# Patient Record
Sex: Female | Born: 1935 | Race: Black or African American | Hispanic: No | Smoking: Never smoker
Health system: Southern US, Community
[De-identification: ages and names within clinical notes are randomized; demographics above are authoritative.]

## PROBLEM LIST (undated history)

## (undated) DIAGNOSIS — I10 Essential (primary) hypertension: Secondary | ICD-10-CM

## (undated) DIAGNOSIS — E669 Obesity, unspecified: Secondary | ICD-10-CM

## (undated) DIAGNOSIS — C50919 Malignant neoplasm of unspecified site of unspecified female breast: Secondary | ICD-10-CM

## (undated) DIAGNOSIS — N19 Unspecified kidney failure: Secondary | ICD-10-CM

## (undated) DIAGNOSIS — I4892 Unspecified atrial flutter: Secondary | ICD-10-CM

## (undated) DIAGNOSIS — R413 Other amnesia: Secondary | ICD-10-CM

## (undated) DIAGNOSIS — F068 Other specified mental disorders due to known physiological condition: Secondary | ICD-10-CM

## (undated) DIAGNOSIS — E119 Type 2 diabetes mellitus without complications: Secondary | ICD-10-CM

## (undated) DIAGNOSIS — N950 Postmenopausal bleeding: Secondary | ICD-10-CM

## (undated) DIAGNOSIS — I5022 Chronic systolic (congestive) heart failure: Secondary | ICD-10-CM

## (undated) DIAGNOSIS — J449 Chronic obstructive pulmonary disease, unspecified: Secondary | ICD-10-CM

## (undated) DIAGNOSIS — D259 Leiomyoma of uterus, unspecified: Secondary | ICD-10-CM

## (undated) DIAGNOSIS — Z9049 Acquired absence of other specified parts of digestive tract: Secondary | ICD-10-CM

## (undated) HISTORY — DX: Unspecified kidney failure: N19

## (undated) HISTORY — DX: Malignant neoplasm of unspecified site of unspecified female breast: C50.919

## (undated) HISTORY — DX: Acquired absence of other specified parts of digestive tract: Z90.49

## (undated) HISTORY — DX: Chronic obstructive pulmonary disease, unspecified: J44.9

## (undated) HISTORY — DX: Essential (primary) hypertension: I10

## (undated) HISTORY — DX: Chronic systolic (congestive) heart failure: I50.22

## (undated) HISTORY — DX: Leiomyoma of uterus, unspecified: D25.9

## (undated) HISTORY — DX: Other amnesia: R41.3

## (undated) HISTORY — DX: Type 2 diabetes mellitus without complications: E11.9

## (undated) HISTORY — DX: Unspecified atrial flutter: I48.92

## (undated) HISTORY — DX: Obesity, unspecified: E66.9

## (undated) HISTORY — DX: Postmenopausal bleeding: N95.0

## (undated) HISTORY — DX: Other specified mental disorders due to known physiological condition: F06.8

---

## 2004-12-15 ENCOUNTER — Emergency Department: Payer: Self-pay | Admitting: Emergency Medicine

## 2005-02-16 ENCOUNTER — Emergency Department: Payer: Self-pay | Admitting: Emergency Medicine

## 2005-08-18 ENCOUNTER — Emergency Department: Payer: Self-pay | Admitting: Emergency Medicine

## 2005-09-06 ENCOUNTER — Encounter: Admission: RE | Admit: 2005-09-06 | Discharge: 2005-10-25 | Payer: Self-pay | Admitting: Family Medicine

## 2005-09-07 ENCOUNTER — Emergency Department: Payer: Self-pay | Admitting: Emergency Medicine
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
## 2006-01-21 ENCOUNTER — Emergency Department (HOSPITAL_COMMUNITY): Admission: EM | Admit: 2006-01-21 | Discharge: 2006-01-21 | Payer: Self-pay | Admitting: Emergency Medicine

## 2006-01-22 ENCOUNTER — Emergency Department (HOSPITAL_COMMUNITY): Admission: EM | Admit: 2006-01-22 | Discharge: 2006-01-22 | Payer: Self-pay | Admitting: Emergency Medicine

## 2006-04-14 ENCOUNTER — Emergency Department: Payer: Self-pay | Admitting: Emergency Medicine

## 2006-06-14 ENCOUNTER — Encounter: Admission: RE | Admit: 2006-06-14 | Discharge: 2006-08-15 | Payer: Self-pay | Admitting: Family Medicine

## 2006-07-21 ENCOUNTER — Ambulatory Visit: Payer: Self-pay | Admitting: Family Medicine

## 2006-08-03 ENCOUNTER — Ambulatory Visit: Payer: Self-pay | Admitting: Family Medicine

## 2006-08-09 ENCOUNTER — Ambulatory Visit: Payer: Self-pay

## 2006-08-22 ENCOUNTER — Ambulatory Visit: Payer: Self-pay | Admitting: Family Medicine

## 2006-09-07 ENCOUNTER — Ambulatory Visit: Payer: Self-pay | Admitting: Family Medicine

## 2006-10-25 ENCOUNTER — Ambulatory Visit: Payer: Self-pay | Admitting: Family Medicine

## 2006-11-02 ENCOUNTER — Ambulatory Visit: Payer: Self-pay | Admitting: Family Medicine

## 2006-11-02 LAB — CONVERTED CEMR LAB: Hgb A1c MFr Bld: 6.8 %

## 2007-01-20 ENCOUNTER — Ambulatory Visit: Payer: Self-pay | Admitting: Family Medicine

## 2007-01-25 ENCOUNTER — Encounter: Payer: Self-pay | Admitting: Family Medicine

## 2007-01-25 DIAGNOSIS — K5900 Constipation, unspecified: Secondary | ICD-10-CM | POA: Insufficient documentation

## 2007-01-25 DIAGNOSIS — J309 Allergic rhinitis, unspecified: Secondary | ICD-10-CM | POA: Insufficient documentation

## 2007-01-25 DIAGNOSIS — I10 Essential (primary) hypertension: Secondary | ICD-10-CM | POA: Insufficient documentation

## 2007-04-26 ENCOUNTER — Telehealth: Payer: Self-pay | Admitting: Family Medicine

## 2007-05-31 ENCOUNTER — Ambulatory Visit: Payer: Self-pay | Admitting: Internal Medicine

## 2007-05-31 DIAGNOSIS — M25559 Pain in unspecified hip: Secondary | ICD-10-CM

## 2007-06-02 ENCOUNTER — Encounter: Admission: RE | Admit: 2007-06-02 | Discharge: 2007-06-02 | Payer: Self-pay | Admitting: Family Medicine

## 2007-06-08 ENCOUNTER — Encounter (INDEPENDENT_AMBULATORY_CARE_PROVIDER_SITE_OTHER): Payer: Self-pay | Admitting: *Deleted

## 2007-08-14 ENCOUNTER — Ambulatory Visit: Payer: Self-pay | Admitting: Family Medicine

## 2007-08-16 ENCOUNTER — Telehealth: Payer: Self-pay | Admitting: Family Medicine

## 2007-08-24 ENCOUNTER — Encounter: Payer: Self-pay | Admitting: Family Medicine

## 2007-08-30 ENCOUNTER — Telehealth: Payer: Self-pay | Admitting: Family Medicine

## 2007-09-15 ENCOUNTER — Ambulatory Visit: Payer: Self-pay | Admitting: Family Medicine

## 2007-11-07 ENCOUNTER — Encounter (INDEPENDENT_AMBULATORY_CARE_PROVIDER_SITE_OTHER): Payer: Self-pay | Admitting: *Deleted

## 2007-11-14 ENCOUNTER — Encounter (INDEPENDENT_AMBULATORY_CARE_PROVIDER_SITE_OTHER): Payer: Self-pay | Admitting: *Deleted

## 2008-01-02 ENCOUNTER — Ambulatory Visit: Payer: Self-pay | Admitting: Family Medicine

## 2008-01-02 LAB — CONVERTED CEMR LAB
ALT: 19 units/L (ref 0–35)
AST: 19 units/L (ref 0–37)
Albumin: 3.3 g/dL — ABNORMAL LOW (ref 3.5–5.2)
Alkaline Phosphatase: 48 units/L (ref 39–117)
BUN: 15 mg/dL (ref 6–23)
CO2: 35 meq/L — ABNORMAL HIGH (ref 19–32)
Calcium: 9.2 mg/dL (ref 8.4–10.5)
Chloride: 103 meq/L (ref 96–112)
Creatinine, Ser: 1.1 mg/dL (ref 0.4–1.2)
GFR calc Af Amer: 61 mL/min
Glucose, Bld: 124 mg/dL — ABNORMAL HIGH (ref 70–99)
HDL: 44.4 mg/dL (ref >39.0)
VLDL: 21 mg/dL (ref 0–40)

## 2008-01-03 ENCOUNTER — Ambulatory Visit: Payer: Self-pay | Admitting: Family Medicine

## 2008-01-03 DIAGNOSIS — E876 Hypokalemia: Secondary | ICD-10-CM

## 2008-01-10 ENCOUNTER — Telehealth (INDEPENDENT_AMBULATORY_CARE_PROVIDER_SITE_OTHER): Payer: Self-pay | Admitting: *Deleted

## 2008-01-17 ENCOUNTER — Ambulatory Visit: Payer: Self-pay | Admitting: Family Medicine

## 2008-01-22 LAB — CONVERTED CEMR LAB
Calcium: 8.8 mg/dL (ref 8.4–10.5)
Creatinine, Ser: 1.2 mg/dL (ref 0.4–1.2)
Creatinine,U: 195.4 mg/dL
GFR calc Af Amer: 55 mL/min
GFR calc non Af Amer: 46 mL/min
Glucose, Bld: 119 mg/dL — ABNORMAL HIGH (ref 70–99)
Hgb A1c MFr Bld: 6.8 % — ABNORMAL HIGH (ref 4.6–6.0)
Potassium: 4 meq/L (ref 3.5–5.1)
Sodium: 144 meq/L (ref 135–145)

## 2008-03-05 ENCOUNTER — Emergency Department (HOSPITAL_COMMUNITY): Admission: EM | Admit: 2008-03-05 | Discharge: 2008-03-05 | Payer: Self-pay | Admitting: Emergency Medicine

## 2008-03-12 ENCOUNTER — Emergency Department (HOSPITAL_COMMUNITY): Admission: EM | Admit: 2008-03-12 | Discharge: 2008-03-13 | Payer: Self-pay | Admitting: Emergency Medicine

## 2008-03-18 ENCOUNTER — Encounter (INDEPENDENT_AMBULATORY_CARE_PROVIDER_SITE_OTHER): Payer: Self-pay | Admitting: *Deleted

## 2008-03-22 ENCOUNTER — Ambulatory Visit: Payer: Self-pay | Admitting: Family Medicine

## 2008-03-22 DIAGNOSIS — S22009A Unspecified fracture of unspecified thoracic vertebra, initial encounter for closed fracture: Secondary | ICD-10-CM | POA: Insufficient documentation

## 2008-03-29 ENCOUNTER — Ambulatory Visit: Payer: Self-pay | Admitting: Family Medicine

## 2008-04-02 ENCOUNTER — Ambulatory Visit: Payer: Self-pay | Admitting: Family Medicine

## 2008-04-16 ENCOUNTER — Encounter: Payer: Self-pay | Admitting: Family Medicine

## 2008-04-19 ENCOUNTER — Encounter: Payer: Self-pay | Admitting: Family Medicine

## 2008-05-02 ENCOUNTER — Encounter: Payer: Self-pay | Admitting: Family Medicine

## 2008-05-03 DIAGNOSIS — C50919 Malignant neoplasm of unspecified site of unspecified female breast: Secondary | ICD-10-CM | POA: Insufficient documentation

## 2008-05-21 ENCOUNTER — Encounter: Admission: RE | Admit: 2008-05-21 | Discharge: 2008-05-21 | Payer: Self-pay | Admitting: Family Medicine

## 2008-06-05 ENCOUNTER — Encounter: Admission: RE | Admit: 2008-06-05 | Discharge: 2008-06-05 | Payer: Self-pay | Admitting: Family Medicine

## 2008-06-05 ENCOUNTER — Encounter (INDEPENDENT_AMBULATORY_CARE_PROVIDER_SITE_OTHER): Payer: Self-pay | Admitting: Diagnostic Radiology

## 2008-06-05 ENCOUNTER — Encounter: Payer: Self-pay | Admitting: Family Medicine

## 2008-06-05 HISTORY — PX: BREAST LUMPECTOMY: SHX2

## 2008-06-06 ENCOUNTER — Telehealth: Payer: Self-pay | Admitting: Family Medicine

## 2008-06-10 ENCOUNTER — Telehealth: Payer: Self-pay | Admitting: Family Medicine

## 2008-06-10 ENCOUNTER — Ambulatory Visit: Payer: Self-pay | Admitting: Family Medicine

## 2008-06-11 LAB — CONVERTED CEMR LAB: Creatinine, Ser: 1.5 mg/dL — ABNORMAL HIGH (ref 0.4–1.2)

## 2008-06-17 ENCOUNTER — Encounter: Payer: Self-pay | Admitting: Family Medicine

## 2008-06-18 ENCOUNTER — Ambulatory Visit: Payer: Self-pay | Admitting: Oncology

## 2008-06-21 ENCOUNTER — Ambulatory Visit: Admission: RE | Admit: 2008-06-21 | Discharge: 2008-08-11 | Payer: Self-pay | Admitting: Radiation Oncology

## 2008-06-24 ENCOUNTER — Encounter: Payer: Self-pay | Admitting: Family Medicine

## 2008-06-25 ENCOUNTER — Encounter: Admission: RE | Admit: 2008-06-25 | Discharge: 2008-06-25 | Payer: Self-pay | Admitting: Family Medicine

## 2008-07-01 ENCOUNTER — Encounter (INDEPENDENT_AMBULATORY_CARE_PROVIDER_SITE_OTHER): Payer: Self-pay | Admitting: *Deleted

## 2008-07-02 ENCOUNTER — Encounter: Admission: RE | Admit: 2008-07-02 | Discharge: 2008-07-02 | Payer: Self-pay | Admitting: Surgery

## 2008-07-04 ENCOUNTER — Ambulatory Visit (HOSPITAL_BASED_OUTPATIENT_CLINIC_OR_DEPARTMENT_OTHER): Admission: RE | Admit: 2008-07-04 | Discharge: 2008-07-04 | Payer: Self-pay | Admitting: Surgery

## 2008-07-04 ENCOUNTER — Encounter: Admission: RE | Admit: 2008-07-04 | Discharge: 2008-07-04 | Payer: Self-pay | Admitting: Surgery

## 2008-07-04 ENCOUNTER — Encounter (INDEPENDENT_AMBULATORY_CARE_PROVIDER_SITE_OTHER): Payer: Self-pay | Admitting: Surgery

## 2008-07-05 ENCOUNTER — Encounter: Payer: Self-pay | Admitting: Family Medicine

## 2008-07-19 ENCOUNTER — Telehealth: Payer: Self-pay | Admitting: Family Medicine

## 2008-07-31 ENCOUNTER — Encounter: Payer: Self-pay | Admitting: Family Medicine

## 2008-10-23 ENCOUNTER — Ambulatory Visit: Payer: Self-pay | Admitting: Oncology

## 2008-10-25 ENCOUNTER — Encounter: Payer: Self-pay | Admitting: Family Medicine

## 2008-11-05 HISTORY — PX: OTHER SURGICAL HISTORY: SHX169

## 2008-11-12 ENCOUNTER — Ambulatory Visit: Payer: Self-pay | Admitting: Family Medicine

## 2008-11-13 ENCOUNTER — Ambulatory Visit: Payer: Self-pay | Admitting: Cardiology

## 2008-11-13 LAB — CONVERTED CEMR LAB
Albumin: 3.3 g/dL — ABNORMAL LOW (ref 3.5–5.2)
Basophils Relative: 0.1 % (ref 0.0–3.0)
Eosinophils Relative: 3.3 % (ref 0.0–5.0)
HCT: 36.3 % (ref 36.0–46.0)
Hgb A1c MFr Bld: 6.5 % — ABNORMAL HIGH (ref 4.6–6.0)
Lymphocytes Relative: 30.1 % (ref 12.0–46.0)
MCHC: 33 g/dL (ref 30.0–36.0)
MCV: 89.6 fL (ref 78.0–100.0)
Platelets: 100 10*3/uL — ABNORMAL LOW (ref 150–400)
Potassium: 2.9 meq/L — ABNORMAL LOW (ref 3.5–5.1)
RBC: 4.05 M/uL (ref 3.87–5.11)
Total Bilirubin: 0.9 mg/dL (ref 0.3–1.2)
Total CHOL/HDL Ratio: 4.5
Triglycerides: 108 mg/dL (ref 0–149)
VLDL: 22 mg/dL (ref 0–40)

## 2008-11-26 ENCOUNTER — Ambulatory Visit: Payer: Self-pay | Admitting: Cardiology

## 2008-11-26 ENCOUNTER — Ambulatory Visit: Payer: Self-pay | Admitting: Internal Medicine

## 2008-11-26 ENCOUNTER — Encounter: Payer: Self-pay | Admitting: Family Medicine

## 2008-11-28 IMAGING — CR DG CHEST 2V
2 series · 2 of 2 positions shown · non-contrast
Comparison: Thoracic spine study from 03/05/2008

CLINICAL DATA: Preop evaluation for breast cancer surgery.

CHEST - 2 VIEW

[w chest pa]
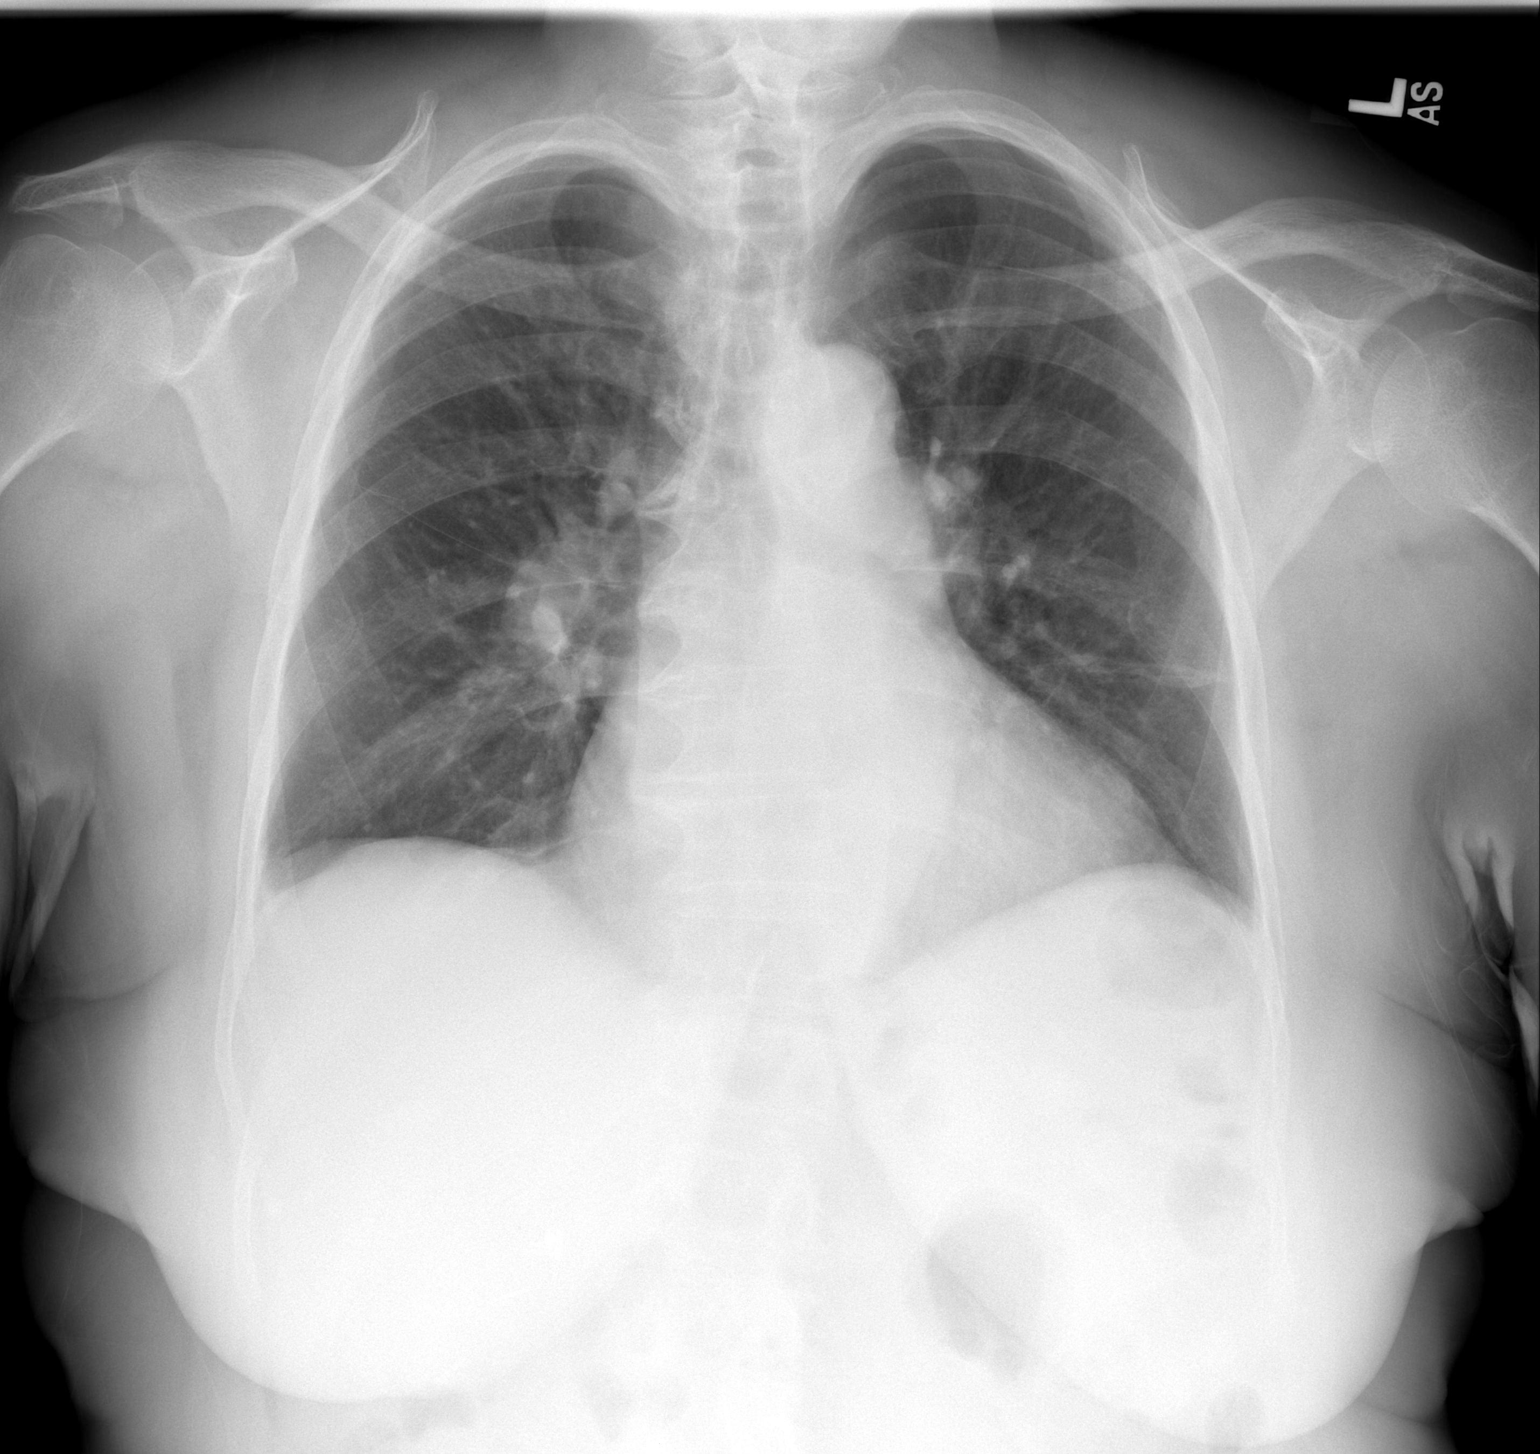

[w chest lat]
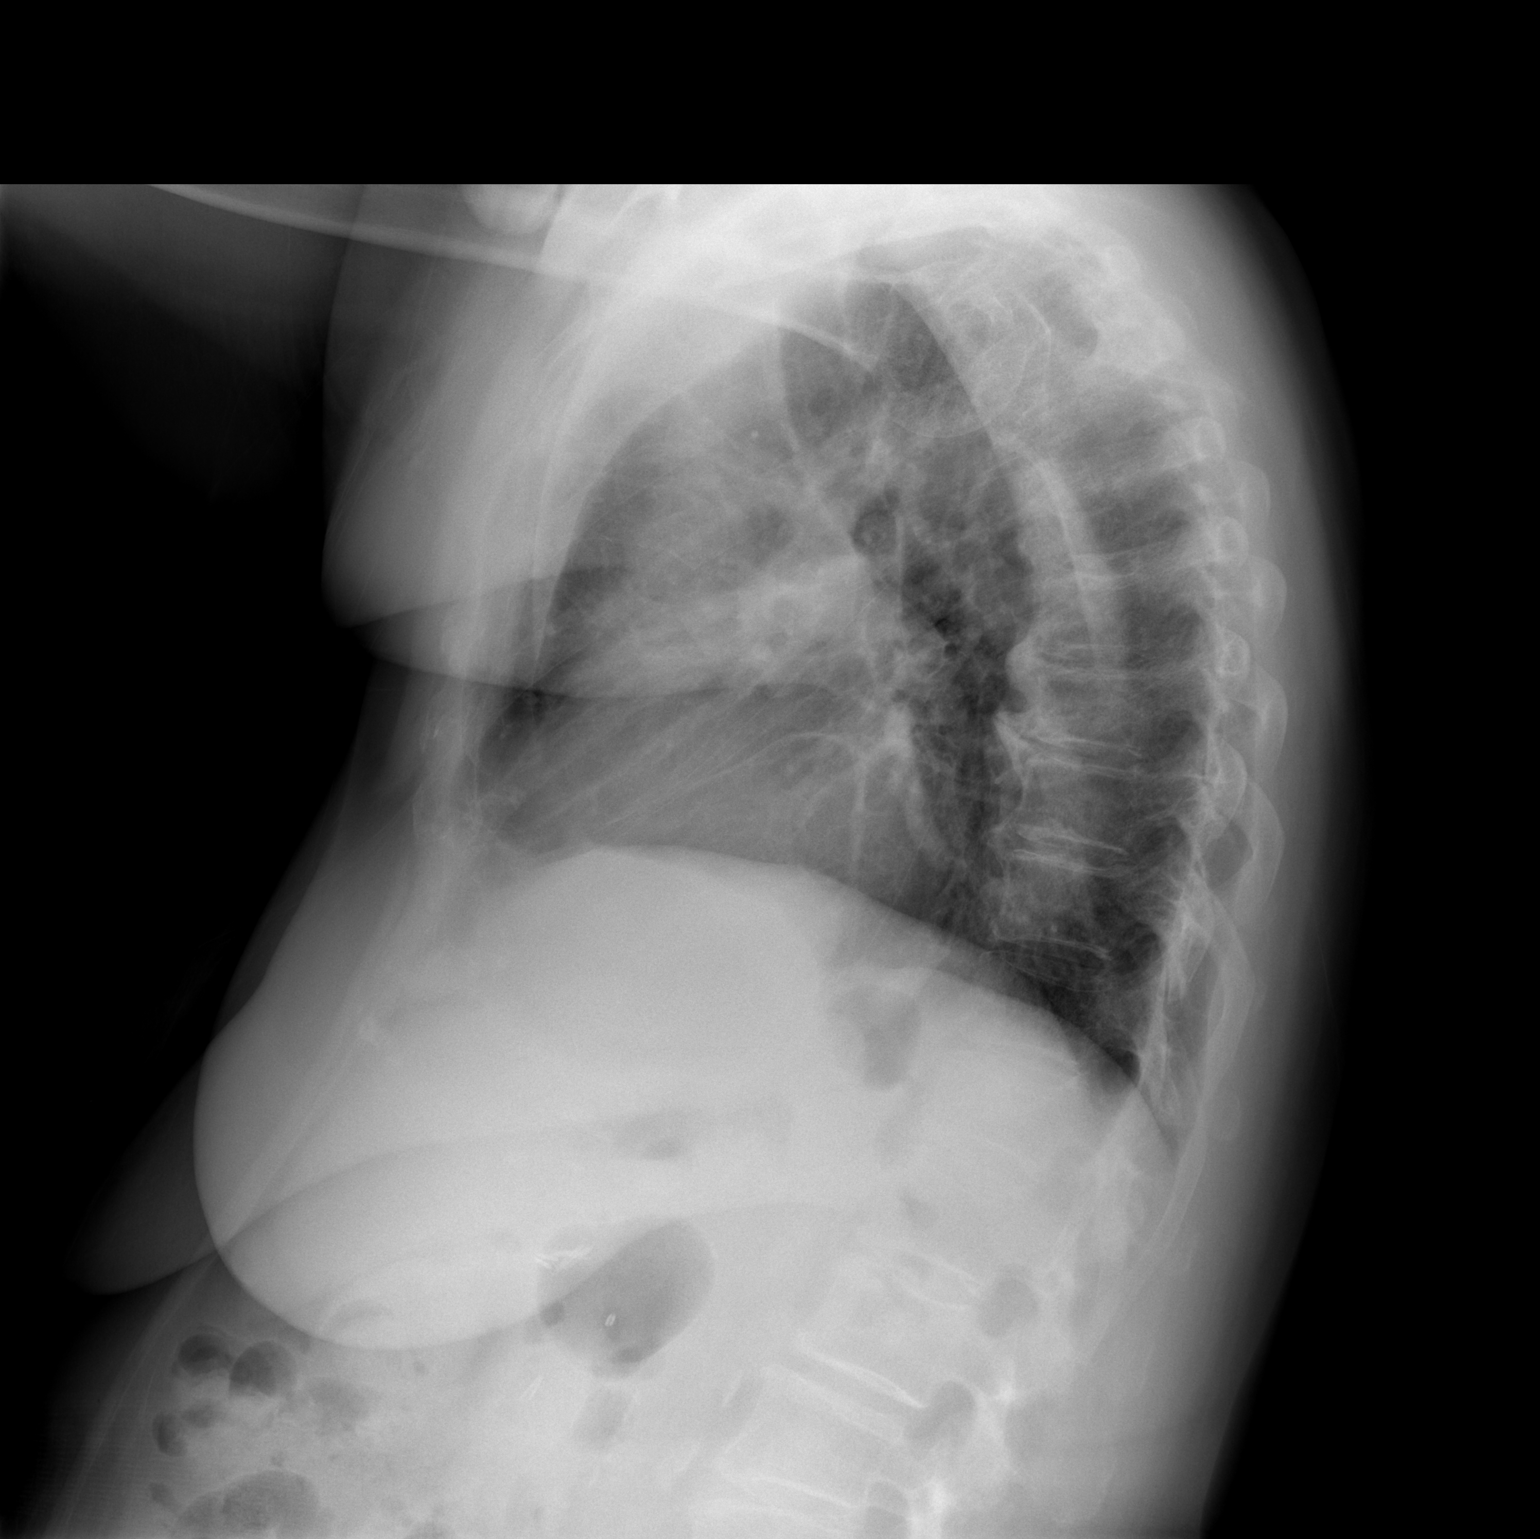

[2 of 2 positions shown; findings below may reference images not displayed]

FINDINGS: Two-view chest shows low lung volumes.  Cardiopericardial
silhouette is at upper limits of normal for size.  Right hilum is
slightly prominent, but this is unchanged since the prior thoracic
spine x-ray and is probably related to vascularity.  No focal
consolidation, edema, or pleural effusion.  Bony structures of the
visualized thorax are intact.  There is some linear scarring in the
left midlung.
IMPRESSION: Low volume film with borderline cardiac enlargement.  No acute
cardiopulmonary findings.

## 2008-12-06 HISTORY — PX: VAGINAL HYSTERECTOMY: SHX2639

## 2008-12-10 ENCOUNTER — Ambulatory Visit: Payer: Self-pay

## 2008-12-13 ENCOUNTER — Encounter: Payer: Self-pay | Admitting: Family Medicine

## 2008-12-13 ENCOUNTER — Ambulatory Visit: Payer: Self-pay | Admitting: Cardiology

## 2008-12-16 LAB — CONVERTED CEMR LAB
BUN: 25 mg/dL — ABNORMAL HIGH (ref 6–23)
CO2: 28 meq/L (ref 19–32)
Calcium: 8.9 mg/dL (ref 8.4–10.5)
Potassium: 3.5 meq/L (ref 3.5–5.3)

## 2008-12-26 ENCOUNTER — Ambulatory Visit: Payer: Self-pay | Admitting: Oncology

## 2009-01-06 DIAGNOSIS — I5022 Chronic systolic (congestive) heart failure: Secondary | ICD-10-CM

## 2009-01-06 HISTORY — DX: Chronic systolic (congestive) heart failure: I50.22

## 2009-01-27 ENCOUNTER — Telehealth: Payer: Self-pay | Admitting: Family Medicine

## 2009-01-27 DIAGNOSIS — F0390 Unspecified dementia without behavioral disturbance: Secondary | ICD-10-CM

## 2009-01-30 ENCOUNTER — Ambulatory Visit: Payer: Self-pay

## 2009-01-30 ENCOUNTER — Encounter: Payer: Self-pay | Admitting: Cardiology

## 2009-02-06 ENCOUNTER — Telehealth: Payer: Self-pay | Admitting: Family Medicine

## 2009-02-07 ENCOUNTER — Telehealth: Payer: Self-pay | Admitting: Family Medicine

## 2009-02-11 ENCOUNTER — Ambulatory Visit: Payer: Self-pay | Admitting: Oncology

## 2009-02-13 ENCOUNTER — Telehealth: Payer: Self-pay | Admitting: Family Medicine

## 2009-02-14 ENCOUNTER — Telehealth: Payer: Self-pay | Admitting: Family Medicine

## 2009-02-14 ENCOUNTER — Encounter: Payer: Self-pay | Admitting: Cardiology

## 2009-02-14 ENCOUNTER — Ambulatory Visit: Payer: Self-pay | Admitting: Cardiology

## 2009-02-14 DIAGNOSIS — E785 Hyperlipidemia, unspecified: Secondary | ICD-10-CM

## 2009-02-14 DIAGNOSIS — R9389 Abnormal findings on diagnostic imaging of other specified body structures: Secondary | ICD-10-CM

## 2009-02-19 ENCOUNTER — Telehealth: Payer: Self-pay | Admitting: Family Medicine

## 2009-02-26 ENCOUNTER — Ambulatory Visit: Payer: Self-pay | Admitting: Cardiology

## 2009-02-26 LAB — CONVERTED CEMR LAB
ALT: 18 units/L (ref 0–35)
Albumin: 3.7 g/dL (ref 3.5–5.2)
Alkaline Phosphatase: 46 units/L (ref 39–117)
Bilirubin, Direct: 0.1 mg/dL (ref 0.0–0.3)
Cholesterol: 187 mg/dL (ref 0–200)
HDL: 47 mg/dL (ref >39)
Indirect Bilirubin: 0.5 mg/dL (ref 0.0–0.9)
LDL Cholesterol: 115 mg/dL — ABNORMAL HIGH (ref 0–99)
Total CHOL/HDL Ratio: 4

## 2009-03-12 ENCOUNTER — Ambulatory Visit: Payer: Self-pay | Admitting: Cardiology

## 2009-03-31 ENCOUNTER — Encounter: Payer: Self-pay | Admitting: Family Medicine

## 2009-04-29 ENCOUNTER — Emergency Department: Payer: Self-pay | Admitting: Emergency Medicine

## 2009-05-01 ENCOUNTER — Encounter: Payer: Self-pay | Admitting: Family Medicine

## 2009-06-17 ENCOUNTER — Encounter: Payer: Self-pay | Admitting: Family Medicine

## 2009-07-29 ENCOUNTER — Emergency Department (HOSPITAL_COMMUNITY): Admission: EM | Admit: 2009-07-29 | Discharge: 2009-07-29 | Payer: Self-pay | Admitting: Emergency Medicine

## 2009-08-18 ENCOUNTER — Ambulatory Visit: Payer: Self-pay | Admitting: Cardiology

## 2009-09-30 ENCOUNTER — Encounter: Payer: Self-pay | Admitting: Cardiology

## 2010-01-06 ENCOUNTER — Telehealth: Payer: Self-pay | Admitting: Family Medicine

## 2010-01-12 ENCOUNTER — Encounter: Payer: Self-pay | Admitting: Family Medicine

## 2010-01-13 ENCOUNTER — Telehealth (INDEPENDENT_AMBULATORY_CARE_PROVIDER_SITE_OTHER): Payer: Self-pay | Admitting: *Deleted

## 2010-01-15 ENCOUNTER — Encounter: Payer: Self-pay | Admitting: Family Medicine

## 2010-02-02 ENCOUNTER — Telehealth: Payer: Self-pay | Admitting: Family Medicine

## 2010-02-06 ENCOUNTER — Ambulatory Visit: Payer: Self-pay | Admitting: Family Medicine

## 2010-02-06 DIAGNOSIS — N950 Postmenopausal bleeding: Secondary | ICD-10-CM

## 2010-02-06 HISTORY — DX: Postmenopausal bleeding: N95.0

## 2010-02-06 LAB — CONVERTED CEMR LAB
Glucose, Urine, Semiquant: NEGATIVE
Ketones, urine, test strip: NEGATIVE
Nitrite: NEGATIVE
pH: 6

## 2010-02-10 ENCOUNTER — Encounter: Payer: Self-pay | Admitting: Family Medicine

## 2010-02-11 ENCOUNTER — Ambulatory Visit: Payer: Self-pay | Admitting: Family Medicine

## 2010-02-11 ENCOUNTER — Telehealth: Payer: Self-pay | Admitting: Family Medicine

## 2010-02-11 DIAGNOSIS — E538 Deficiency of other specified B group vitamins: Secondary | ICD-10-CM | POA: Insufficient documentation

## 2010-02-11 LAB — CONVERTED CEMR LAB
ALT: 12 units/L (ref 0–35)
AST: 15 units/L (ref 0–37)
BUN: 18 mg/dL (ref 6–23)
Basophils Relative: 0.6 % (ref 0.0–3.0)
Cholesterol: 198 mg/dL (ref 0–200)
Eosinophils Relative: 3.6 % (ref 0.0–5.0)
HCT: 35.9 % — ABNORMAL LOW (ref 36.0–46.0)
HDL: 64.4 mg/dL (ref >39.00)
Hemoglobin: 11.3 g/dL — ABNORMAL LOW (ref 12.0–15.0)
LDL Cholesterol: 111 mg/dL — ABNORMAL HIGH (ref 0–99)
Lymphocytes Relative: 22.4 % (ref 12.0–46.0)
Microalb, Ur: 0.7 mg/dL (ref 0.0–1.9)
Platelets: 84 10*3/uL — ABNORMAL LOW (ref 150.0–400.0)
Potassium: 3.6 meq/L (ref 3.5–5.1)
TSH: 2.19 microintl units/mL (ref 0.35–5.50)
Total CHOL/HDL Ratio: 3

## 2010-02-17 ENCOUNTER — Telehealth: Payer: Self-pay | Admitting: Family Medicine

## 2010-02-17 ENCOUNTER — Encounter (INDEPENDENT_AMBULATORY_CARE_PROVIDER_SITE_OTHER): Payer: Self-pay | Admitting: *Deleted

## 2010-04-07 ENCOUNTER — Telehealth (INDEPENDENT_AMBULATORY_CARE_PROVIDER_SITE_OTHER): Payer: Self-pay | Admitting: *Deleted

## 2010-04-16 ENCOUNTER — Telehealth: Payer: Self-pay | Admitting: Family Medicine

## 2010-07-08 ENCOUNTER — Emergency Department (HOSPITAL_COMMUNITY): Admission: EM | Admit: 2010-07-08 | Discharge: 2010-07-08 | Payer: Self-pay | Admitting: Emergency Medicine

## 2010-07-08 ENCOUNTER — Telehealth: Payer: Self-pay | Admitting: Family Medicine

## 2010-09-02 ENCOUNTER — Emergency Department: Payer: Self-pay | Admitting: Emergency Medicine

## 2010-09-08 ENCOUNTER — Encounter: Payer: Self-pay | Admitting: Family Medicine

## 2010-12-28 ENCOUNTER — Encounter: Payer: Self-pay | Admitting: Family Medicine

## 2011-01-05 NOTE — Progress Notes (Signed)
Summary: pt going to ER  Phone Note Call from Patient   Caller: Patient Call For: Kerby Nora MD Summary of Call: Pt had appt to see you today for follow up but she says she fell a couple of days ago and her legs and arms are hurting.  She thinks she needs to go to the ER. I told her I would cancel her appt but that she will need to call back to schedule follow up visit. Initial call taken by: Lowella Petties CMA,  July 08, 2010 1:08 PM

## 2011-01-05 NOTE — Letter (Signed)
Summary: Unable To Reach-Consult Scheduled  [place] [facility]  [address] [place], [place] [address]   Phone: [phone]  Fax: [phone]    01/15/2010 MRN: [id number]    Dear Ms. Stepka,   We have been unable to reach you by phone.  Please contact our office with an updated phone number as soon as possible.  Please ask to speak to Dr. Daphine Deutscher nurse when calling.       Thank you,    Linde Gillis, CMA (AAMA) [place] [facility]

## 2011-01-05 NOTE — Progress Notes (Signed)
Summary: Pt cant afford benicar  Phone Note From Pharmacy   [facility]  [address] [phone]* Summary of Call: Note from pharmacy states that pt cannot afford the benicar and is requesting something else.  Please advise. Initial call taken by: Lowella Petties CMA,  January 13, 2010 3:49 PM  Follow-up for Phone Call        Please see last phone note..ask pharm out of other other listed rx's what would be covered for her. If none tell patient we will HAVE to do generic.  Follow-up by: Kerby Nora MD,  January 13, 2010 11:59 PM  Additional Follow-up for Phone Call Additional follow up Details #1::        Spoke with the pharmacist at [facility] [address] and he advised that Micardis, Diovan, and Atacand were all in the same price range as Benicar.  Benicar is about $115 for 30 pills.  Spoke with Dr. Ermalene Searing about above and was advised that unless patient is willing to pay out of pocket then the only other choice would be a generic medication.  Called patient on cell phone and got no answer or machine.  Will call back later.  Linde Gillis CMA Duncan Dull)  January 14, 2010 8:35 AM   Called patient back again, no answer and no machine.  Linde Gillis CMA Duncan Dull)  January 14, 2010 11:34 AM   Called patient got no answer or machine.  Linde Gillis CMA Duncan Dull)  January 14, 2010 12:27 PM   Called patient again, no answer no machine.  Linde Gillis CMA Duncan Dull)  January 14, 2010 3:10 PM   Called patient again, no answer no machine.  Linde Gillis CMA Duncan Dull)  January 14, 2010 4:57 PM   Called patient on cell phone, received message saying that the person you are trying to reach is unavailable at this time.  Will call back later.  Linde Gillis CMA Duncan Dull)  January 15, 2010 8:28 AM   Called patient and got no answer or machine.  Will keep trying to reach her but will mail out letter as well.  Linde Gillis CMA Duncan Dull)  January 15, 2010 11:10 AM     Additional Follow-up for Phone Call Additional follow up Details  #2::    called and patient number has been disconnected now Follow-up by: Benny Lennert CMA (AAMA),  January 16, 2010 9:01 AM  New/Updated Medications: LOSARTAN POTASSIUM-HCTZ 100-12.5 MG TABS (LOSARTAN POTASSIUM-HCTZ) 1 tab by mouth daily Prescriptions: LOSARTAN POTASSIUM-HCTZ 100-12.5 MG TABS (LOSARTAN POTASSIUM-HCTZ) 1 tab by mouth daily  #30 x 11   Entered and Authorized by:   Kerby Nora MD   Signed by:   Kerby Nora MD on 01/14/2010   Method used:   [facility]        [facility] [employer] [phone]* (retail)       [address] [place], [place]  [address]       Ph: [phone] or [phone]       Fax: [phone]   RxID:   [id number]
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
## 2011-01-05 NOTE — Progress Notes (Signed)
Summary: neice is asking for letter  Phone Note Call from Patient   Caller: neice- Drema Pry  [phone] Summary of Call: Orlie Dakin is asking that you write her a letter stating that she is not trying to send pt to an institution, she only wants the pt to have regular doctor visits that she will show up at.  Apparently the pt is telling family members that Marcelino Freestone is trying to send her somewhere. Shiron wants this letter for a family meeting that they are having.  Says she has discussed this with Heather.  (Pt didnt show up for repeat CBC yesterday) Initial call taken by: Lowella Petties CMA,  February 17, 2010 9:31 AM  Follow-up for Phone Call        Please simply print out the phone note from when neice called for her. I can not write a letter verifing her intentions, but I will give her documentation of the office phone conversation.  Follow-up by: Kerby Nora MD,  February 17, 2010 1:15 PM  Additional Follow-up for Phone Call Additional follow up Details #1::        printed and patient notified to pick up Additional Follow-up by: Benny Lennert CMA Duncan Dull),  February 17, 2010 2:33 PM

## 2011-01-05 NOTE — Medication Information (Signed)
Summary: Order for [facility]  Order for [facility]   Imported By: Maryln Gottron 09/14/2010 15:53:30  _____________________________________________________________________  External Attachment:    Type:   Image     Comment:   External Document

## 2011-01-05 NOTE — Progress Notes (Signed)
Summary: not acting her self  Phone Note Call from Patient Call back at [phone]   Caller: Monica Hurst- patient's neice Call For: Kerby Nora MD Summary of Call: Monica Hurst called to schedule app for her aunt. I scheduled her for Friday to see you. Monica Hurst says that her aunt is not acting normal. She is always feeling angry. Does not want help from anyone and is very secretive. Monica Hurst says that everyone has noticed the change in her. She says that patient has lost alot of weight. She just wanted to let you know some of the reasons she set up the app because she will not be able to come in with her to app.  Initial call taken by: Melody Comas,  February 02, 2010 10:00 AM  Follow-up for Phone Call        :Let neice know.Marland KitchenMarland KitchenMarland KitchenThank you for the info.Marland KitchenMarland KitchenI strongly recommend that appt be made when someone can come with her... she has some dementia issues that sound like they are worsening.  i have not seen the patient in quite some time.  Follow-up by: Kerby Nora MD,  February 03, 2010 1:18 PM  Additional Follow-up for Phone Call Additional follow up Details #1::        Left message for patients niece to call back Additional Follow-up by: Benny Lennert CMA Duncan Dull),  February 03, 2010 2:42 PM    Additional Follow-up for Phone Call Additional follow up Details #2::    Patient advised.Consuello Masse CMA  Follow-up by: Benny Lennert CMA Duncan Dull),  February 03, 2010 2:47 PM   Appended Document: not acting her self Patients niece says that she will try really hard to come with her.

## 2011-01-05 NOTE — Letter (Signed)
Summary: Unable To Reach-Consult Scheduled  [place] at [facility]  [address] [place], [place] [address]   Phone: [phone]  Fax: [phone]    01/12/2010 MRN: [id number]    Dear Ms. Hollenkamp,   We have been unable to reach you by phone.  Please contact our office with an updated phone number.        Thank you,     Linde Gillis, CMA (AAMA) [place] at [facility]

## 2011-01-05 NOTE — Letter (Signed)
Summary: Generic Letter  [place] [facility]  [address] [place], [place] [address]   Phone: [phone]  Fax: [phone]    02/17/2010  Monique Switalski [address] [place], [place]  [address]  Dear Ms. Binion,    Dr. Ermalene Searing is legally not able to write letter. She did give you documentation on what you and her discussed.       Sincerely,   Benny Lennert CMA (AAMA)

## 2011-01-05 NOTE — Progress Notes (Signed)
Summary: regarding cbc  Phone Note Other Incoming   Caller: [facility]/ Clydie Braun Summary of Call: Large amounts of plt clumping, they wanted a differnt blood sample but, I am not able to contact the patient.  Initial call taken by: Mills Koller,  February 11, 2010 11:13 AM  Follow-up for Phone Call        She is difficult to reach at times...please keep trying.  Follow-up by: Kerby Nora MD,  February 11, 2010 11:48 AM  Additional Follow-up for Phone Call Additional follow up Details #1::        I did talk to Marisue Brooklyn, her niece. She will try to get patient to back in. Problems with dementia Additional Follow-up by: Mills Koller,  February 11, 2010 12:21 PM    Additional Follow-up for Phone Call Additional follow up Details #2::    Spoke with pt, lab appt scheduled for 3/14. Follow-up by: Lowella Petties CMA,  February 11, 2010 1:08 PM

## 2011-01-05 NOTE — Progress Notes (Signed)
  Phone Note Refill Request Message from:  [employer] on Apr 16, 2010 8:07 AM  Refills Requested: Medication #1:  avaide 300-12.5mg    Supply Requested: 1 month not on medicatiion list    Method Requested: Electronic Initial call taken by: Benny Lennert CMA Duncan Dull),  Apr 16, 2010 8:09 AM  Follow-up for Phone Call        Denied..in 01/2010...insurance requested change to generic..she shoulfd have D/C' avalide and instead should be taking losartan /HCTZ. If she does not want generic, we can fill avalide, but she needs to not take tohe losartan/HCTZ.  Follow-up by: Kerby Nora MD,  Apr 16, 2010 10:52 PM

## 2011-01-05 NOTE — Progress Notes (Signed)
Summary: Alternative for AVALIDE  Phone Note Refill Request Message [facility]:  [facility] [phone] on January 06, 2010 4:05 PM  Refills Requested: Medication #1:  AVALIDE 300-12.5 MG  TABS 1 by mouth once daily fax request is asking for different drug, there is a problem with the manf. please advise   Method Requested: Fax to Local Pharmacy Initial call taken by: DeShannon Smith CMA Duncan Dull),  January 06, 2010 4:05 PM  Follow-up for Phone Call        We can change her to generic losartan/HTCZ if she is agreeable..let me know if she is [place] with cahnge.  Follow-up by: Kerby Nora MD,  January 06, 2010 5:57 PM  Additional Follow-up for Phone Call Additional follow up Details #1::        Called home number that is listed and its disconnected, cell number no one is answering and got no voicemail.  Linde Gillis CMA Duncan Dull)  January 07, 2010 11:31 AM   Patient says that she is not too sure about taking generic medications.  The Avalide worked well for her and she does not want to be put on a generic because she doesn't know how her body will tolerate a generic medication.  Is there anyway possible she could get Avalide at another pharmacy, or is it impossible to get Avalide altogether?  Please advise.   Additional Follow-up by: Linde Gillis CMA [facility] Dull),  January 07, 2010 4:27 PM    Additional Follow-up for Phone Call Additional follow up Details #2::    I do not know the answer to that question..please call pharm for this elderly pt to find out about it..I am unaware about avalide not being available..I have not been told this by other pharm. Follow-up by: Kerby Nora MD,  January 07, 2010 5:04 PM  Additional Follow-up for Phone Call Additional follow up Details #3:: Details for Additional Follow-up Action Taken: [facility], [phone] the manufacture is not making Avalide right now due to a recall.  Avalide is unavailable until further notice.  Please advise.  Linde Gillis CMA Duncan Dull)   January 08, 2010 1:22 PM   Let pt know this... we can change to generic similar med or brand similar med if she wishes...either is fine, but they may not have avalide for a while and she needs a replacement BP med. Kerby Nora MD  January 08, 2010 5:16 PM   Called patient, no answer and no voicemail on cell phone number.  Will call again later.  Linde Gillis CMA Duncan Dull)  January 09, 2010 8:44 AM   Spoke with patient, she wants the brand name of whatever medication Dr. Ermalene Searing puts her on in place of Avalide, no generic.  Linde Gillis CMA Duncan Dull)  January 09, 2010 2:26 PM   Benicar HCT, diovan HCT, Micardis HCT, OR Atacand HCT Kerby Nora MD  January 09, 2010 3:14 PM   Patient does not have a preference.  Does not really know one medication from the other but trusts your advise.  Linde Gillis CMA Duncan Dull)  January 09, 2010 3:19 PM   Called patient and got no answer or no voicemail on cell number.  Linde Gillis CMA Duncan Dull)  January 09, 2010 5:02 PM   Called patient and got no answer or voicemail.  Linde Gillis CMA [facility] Dull)  January 12, 2010 8:08 AM   Called patient again and got no answer and  no voicemail.  Will send a letter requesting that she contact our office.   Additional Follow-up by: Linde Gillis CMA [facility] Dull),  January 12, 2010 10:32 AM  New/Updated Medications: BENICAR HCT 40-12.5 MG TABS (OLMESARTAN MEDOXOMIL-HCTZ) 1 tab by mouth daily Prescriptions: BENICAR HCT 40-12.5 MG TABS (OLMESARTAN MEDOXOMIL-HCTZ) 1 tab by mouth daily  #30 x 11   Entered and Authorized by:   Kerby Nora MD   Signed by:   Kerby Nora MD on 01/09/2010   Method used:   [facility] to        [facility] [phone]* (retail)       [address] [place], [place]  [address]       Ph: [phone] or [phone]       Fax: [phone]   RxID:   [id number]

## 2011-01-05 NOTE — Progress Notes (Signed)
----   Converted from flag ---- ---- 03/17/2010 10:30 AM, Melody Comas wrote: neice shiron and nephew going to try to get pt in for cbc, she never came in for repeat labs as advised ------------------------------

## 2011-03-02 ENCOUNTER — Other Ambulatory Visit: Payer: Self-pay | Admitting: Family Medicine

## 2011-03-13 LAB — URINALYSIS, ROUTINE W REFLEX MICROSCOPIC
Bilirubin Urine: NEGATIVE
Ketones, ur: NEGATIVE mg/dL
Nitrite: NEGATIVE
Protein, ur: NEGATIVE mg/dL
Urobilinogen, UA: 0.2 mg/dL (ref 0.0–1.0)
pH: 5.5 (ref 5.0–8.0)

## 2011-03-13 LAB — URINE MICROSCOPIC-ADD ON

## 2011-04-20 NOTE — Op Note (Signed)
NAME:  Genther, Aluna               ACCOUNT NO.:  [id number]   MEDICAL RECORD NO.:  [id number]          PATIENT TYPE:  AMB   LOCATION:  [facility]                          FACILITY:  [facility]   PHYSICIAN:  Currie Paris, M.D.DATE OF BIRTH:  May 18, 1924   DATE OF PROCEDURE:  07/04/2008  DATE OF DISCHARGE:                               OPERATIVE REPORT   PREOPERATIVE DIAGNOSIS:  Clinical stage I right breast cancer, upper  inner quadrant.   POSTOPERATIVE DIAGNOSIS:  Clinical stage I right breast cancer, upper  inner quadrant.   OPERATION:  Needle-localized excision of right breast cancer with  placement of MammoSite CED.   SURGEON:  Currie Paris, MD   ANESTHESIA:  General.   CLINICAL HISTORY:  This is an 77 year old lady recently found to have a  small right breast cancer which was right about the 3 o'clock position  of her breast, initially thought to be just into the lower inner  quadrant but on films today looked like it was just into the upper inner  quadrant.  The guidewire was placed medially and traveled laterally  towards the areola.  The thickened portion of the guidewire was actually  exposed, so the guidewire was not in very far as this was fairly  superficial lesion.   DESCRIPTION OF PROCEDURE:  The patient was seen in the holding area and  we reviewed the plans for the procedure.  She had seen Dr. Dorothy Puffer  and had initially decided against the MammoSite radiation therapy  postoperatively and instead electing for standard 7-week course.  However, in the interval, she has decided that if possible she would  like to have the MammoSite device placed and do MammoSite radiation.  I  reviewed again with her the fact that she would have a balloon catheter  in, that we would need to get her back tomorrow for some scans and  replace the catheter in my office on Monday.  She also understood that I  might not be able to place the catheter and even if we did, we may not  have good margins or good skin-to-balloon distance and that would  preclude MammoSite radiation.  She understood all of this and wished to  proceed.   The patient was then taken to the operating room and after satisfactory  general anesthesia had been obtained, the breast was prepped and draped.  The time-out was done.   The guidewire as noted entered medially and tracked laterally, so I  started just medial to the guidewire, took an ellipse of skin ending at  the areolar margin.  I raised some skin flaps and then went well around  the guidewire and well below it.  It appeared that the guidewire was in  the very middle of my specimen.  I sent this for specimen mammography  using the one we have in the room.   While waiting for that to develop, I went ahead and took a thin  approximately 5-7 mm margin of additional tissue from superior, inferior  deep, and lateral margins that lateral being underneath the areolar  complex, so  that I thought I was well around and had extra margin in all  directions.   I made sure everything was dry.  I thought that I could close the  superficial breast tissue enough to put a MammoSite balloon and have a 1  cm or so skin-to-balloon distance.  The breast seemed primarily fatty,  but I thought that we could get this closed enough to hold and maintain  a balloon essentially if we use a lower filled volume.   We then tested the balloon and it appeared intact.  I made a counter  incision just medial to the main one and threaded the device into that  and left it unfilled for a while.  I then closed the breast in layers  with 3-0 Vicryl followed by 4-0 Monocryl subcuticular.   The device was then inflated to 35 mL and using ultrasound I confirmed  we had at least 1-cm margin in all directions around this.  I thought it  might be possible to expand it further if necessary, but we wanted to  wait until a CT could be done to evaluate for the need for  additional  fill volume.   Sterile dressings were applied.  The patient tolerated the procedure  well.  There were no complications.  All counts were correct.      Currie Paris, M.D.  Electronically Signed     CJS/MEDQ  D:  07/04/2008  T:  07/05/2008  Job:  [id number]   cc:   Kerby Nora, MD  Radene Gunning, MD, PhD

## 2011-04-20 NOTE — Assessment & Plan Note (Signed)
[facility] OFFICE NOTE   NAME:Monica Hurst, Monica Hurst                      MRN:          [id number]  DATE:12/13/2008                            DOB:          [birth year]    PRIMARY CARE PHYSICIAN:  Kerby Nora, MD   ONCOLOGIST:  Jethro Bolus, MD   OBSTETRICS AND GYNECOLOGIST:  Edwina Barth, MD, [facility] in  [place].   HISTORY OF PRESENT ILLNESS:  This is an 77 year old with a history of  hypertension, diabetes, and hyperlipidemia who presents to Cardiology  Clinic for evaluation of cardiac risk prior to a hysteroscopy that  likely will be under full anesthesia.  The patient has a history of  breast cancer, which was diagnosed back in July.  She had MammoSite  adjuvant radiation treatment as well as lumpectomy also in July 2009.  She is being considered for tamoxifen or aromatase inhibitor therapy.  She has developed some mild postmenopausal vaginal bleeding and there is  a plan to do a hysteroscopy.  An endometrial biopsy cannot be done due  to cervical stenosis; therefore, the patient needs to undergo full  anesthesia for the procedure.  She is referred here for cardiac  evaluation pre-procedure.  The patient does have several risk factors of  coronary artery disease including hypertension, diabetes, and  hyperlipidemia.  When I last saw the patient, she reported that ever  since this summer when she had her breast surgery, she has been having a  peculiar feeling in her chest periodically.  She describes it as mild  chest heaviness that happens a couple of times a week.  When I saw her  last, she thought that this was brought on by doing things like climbing  the steps.  However, now she is not quite sure.  She states that she is  not having the feeling as much and when it happens, often it will just  happen at rest.  She is really only having this mild chest heaviness  about once a week.  She states that  she is not getting short of breath  when walking on flat ground.  She is mildly short of breath if she walks  up an incline.  When she does get the mild chest heaviness, it only  lasts for a couple of minutes.  The patient also was found to be  bradycardic.  Her heart rate has been in the 50s-60s.  However, she  denies any lightheadedness, syncope, or palpitations.  She has never  fallen.  She did report some pain in her right calf with exertion when I  saw her last, however, she had arterial Doppler studies that per  preliminary report were normal. Overall, the patient states she has just  felt weak and fatigued since her breast surgery this summer.  We did do  a Lexiscan Myoview that did have some possible anterior ischemia with  preserved LV systolic function.  Of note, the patient is not a very good  historian and I am not sure of her level of understanding of the  procedures that  she has undergone recently and that she will need to  undergo.   PAST MEDICAL HISTORY:  1. Right-sided breast cancer.  The patient is status post lumpectomy      in July 2009.  This was followed by MammoSite adjuvant radiation      treatment.  2. Mild dementia.  3. Hypertension.  4. Type 2 diabetes.  5. Obesity.  6. History of cholecystectomy.  7. Fibroid uterus with postmenopausal vaginal bleeding.  8. Lexiscan Myoview done in December 2009 showed EF 70%.  No wall      motion abnormalities.  There was an inferior defect that was likely      artifact.  There was possibly some anterior ischemia.   SOCIAL HISTORY:  The patient lives with her sister in [place].  She  has no children.  She has nieces and nephews.  She does not smoke, drink  alcohol, or use illicit drugs.   FAMILY HISTORY:  There is no family history of premature coronary artery  disease.   MEDICATIONS:  1. Toprol-XL 25 mg daily.  2. Aspirin 81 mg daily.  3. Irbesartan/HCTZ 300/12.5 mg daily.  4. Metformin 500 mg daily.  5. Lasix  40 mg daily.  6. Potassium chloride 20 mEq daily.  7. Aspirin 81 mg daily.   Most recent labs in December 2009; LDL 130, HDL 44, triglycerides 108,  potassium 2.9, creatinine 1.2, and hemoglobin A1c 6.5.   PHYSICAL EXAMINATION:  VITAL SIGNS:  Blood pressure is 112/60 and heart  rate is 56 and regular.  GENERAL:  This is a mildly obese female in no apparent distress.  NEUROLOGIC:  The patient is alert and oriented x3.  She does seem to be  mildly confused, but has a normal affect.  NECK:  There is no JVD.  There is no thyromegaly or thyroid nodule.  LUNGS:  Clear to auscultation bilaterally.  CARDIOVASCULAR:  Heart regular.  S1 and S2.  No S3.  No S4.  No murmur.  No carotid bruit.  EXTREMITIES:  There is no peripheral edema.  ABDOMEN:  Soft and nontender.  No hepatosplenomegaly.  Normal bowel  sounds.   ASSESSMENT AND PLAN:  This is an 77 year old with a history of  hypertension, diabetes, and hyperlipidemia, who presents to Cardiology  Clinic for evaluation prior to planned hysteroscopy for postmenopausal  vaginal bleeding.  1. Coronary artery disease risk.  The patient does have a number of      risk factors for coronary artery disease including diabetes,      hypertension, and hyperlipidemia.  She has had a Lexiscan Myoview      done that showed preserved left ventricular systolic function, but      there is possibly some anterior ischemia.  The patient actually      seems to be fairly minimally symptomatic now.  Last time I saw her,      she reported some mild exertional chest heaviness, but now she does      not seem to have any exertional chest heaviness.  Now she has rare      atypical mild chest pain.  She is a poor historian.  I talked to      her at length about the implications of her stress test.  I told      her that a heart catheterization may show significant blockages,      and in that situation, percutaneous coronary intervention might      decrease her symptoms.  However, percutaneous coronary intervention      would probably be unlikely to change her overall prognosis.  I do      think that aggressive medical management would probably be      equivalent for her overall prognosis to doing a heart      catheterization, finding a lesion, and placing a stent.  Her      symptoms seem mild and atypical, so I am not sure what benefit she      would get from PCI, given the fact that her LV EF is preserved.  As      mentioned, I did offer the patient heart catheterization; however,      she does decline at this time and wants to continue aggressive      medical management.  I think this is a reasonable choice.  Right      now, she is on good medicine.  She will continue on Toprol-XL 50 mg      a day.  Her heart rate is in the 50s, this is ideal.  I am going to      increase her Lipitor to 40 mg a day.  She will continue her ARB and      she will continue her aspirin.  We will additionally add a low dose      of Imdur, in case these atypical symptoms that she has are      ischemic, and hopefully this will increase her ischemic threshold.  2. Hyperlipidemia.  As mentioned, given the fact that the patient may      have obstructive coronary artery disease, I think it is reasonable      to increase her statin with a goal of LDL less than 70.  We will      check her lipids in 3 months as well as LFTs.  3. Hypertension.  The patient's blood pressure is under excellent      control on her current medication.  She should limit her use of      ibuprofen.  4. Pain in the patient's right calf.  According to preliminary reports      from the arterial Doppler studies, there is no evidence for      significant peripheral arterial disease.  5. Preoperative risk.  The patient is at higher risk for a surgical      procedure given her probable coronary ischemia.  However, she is      planning on undergoing a low-risk procedure (hysteroscopy under      general anesthesia).   I think it would be reasonable for her to      undergo this procedure.  She will need to continue on aggressive      medical management.  She should be continued perioperatively on her      beta-blocker and her statin.  I do not think that heart      catheterization with potential coronary intervention prior to      surgery would decrease her perioperative risk as has been shown      from multiple trials.  I did speak with Dr. Harold Hedge about this.  6. We will obtain an echocardiogram to assess her wall motion.     Marca Ancona, MD  Electronically Signed    DM/MedQ  DD: 12/13/2008  DT: 12/14/2008  Job #: [id number]   cc:   Kerby Nora, MD  Jethro Bolus, MD  [facility]

## 2011-04-20 NOTE — Assessment & Plan Note (Signed)
[facility] OFFICE NOTE   NAME:Monica Hurst, Monica Hurst                      MRN:          [id number]  DATE:11/13/2008                            DOB:          December 13, 1923    PRIMARY CARE PHYSICIAN:  Kerby Nora, MD   RADIATION ONCOLOGIST:  Radene Gunning, MD, PhD   ONCOLOGIST:  Jethro Bolus, MD   OB/GYN:  Dr. Ladean Raya Kincius   HISTORY OF PRESENT ILLNESS:  This is an 77 year old with the history of  hypertension, diabetes, and hyperlipidemia who presents to Cardiology  Clinic for evaluation preoperatively before her hysteroscopy.  The  patient does have a history of breast cancer that was diagnosed back in  July.  She had  MammoSite adjuvant radiation treatment, as well as a  lumpectomy, both done in July 2009.  She is being considered for either  tamoxifen or aromatase inhibitor therapy.  However, she has mentioned  that she does have some postmenopausal vaginal bleeding.  There is a  plan to do a hysteroscopy.  An endometrial biopsy cannot be done due to  cervical stenosis.  The patient tells me that she may need to undergo  full anesthesia for the hysteroscopy.  The patient does have several  risk factors for coronary disease including hypertension, diabetes, and  hyperlipidemia.  I did talk to the patient a bit about her symptoms.  She states that ever since this summer she has been getting a peculiar  feeling in her chest periodically.  When I questioned further, she  describes this as a chest heaviness, it is fairly mild.  She gets it a  couple times a week.  She is a little unclear about what happens, but  the only thing she notices that reproducibly bring it on is climbing a  flight of steps.  She also gets short of breath mildly after she climbs  a flight of steps.  She says that the chest heaviness in general last  few minutes and resolves at rest.  Additionally, the patient has been  found to be bradycardic.   Her heart rate was in the 50s at her primary  care physician's office.  Today, her heart rate is around 60.  She  denies lightheadedness, syncope, presyncope, or palpitations.  She has  never fallen.  The patient also reports some pain in her right calf that  comes on with exertion such as walking for about 20 feet, this will  resolve with rest.   PAST MEDICAL HISTORY:  1. Right-sided breast cancer, the patient is status post lumpectomy in      July 2009, this was followed by MammoSite adjuvant radiation      treatment, tamoxifen, and her last aromatase inhibitor therapy is      being planned now.  2. Mild dimension.  3. Hypertension.  4. Type 2 diabetes.  5. Obesity.  6. History of cholecystectomy.  7. Fibroid uterus with postmenopausal vaginal bleeding.   SOCIAL HISTORY:  The patient lives with her sister in [place].  She  has no children.  She does have  nieces and nephew.  She does not smoke,  drink alcohol, or use illicit drugs.   FAMILY HISTORY:  The patient has a family history of breast and uterine  cancer.  She does not have a history of premature coronary artery  disease.   MEDICATIONS:  1. Atenolol 25 mg twice a day.  2. Aspirin 81 mg a day.  3. Irbesartan/hydrochlorothiazide 300/12.5 daily.  4. Metformin 500 mg daily.  5. Lasix 40 mg daily.  6. Potassium chloride 20 mEq daily.  7. Ibuprofen 800 mg t.i.d.  8. Alendronate 70 mg every week.   REVIEW OF SYSTEMS:  Negative except as noted in the history of present  illness.   Most recent labs were from December 2009, showed an LDL of 130, HDL 44,  triglycerides 108, potassium 2.9, creatinine 1.2, hemoglobin A1c of 6.5.   EKG was reviewed, this shows normal sinus rhythm, normal EKG rate of  about 60.   PHYSICAL EXAMINATION:  VITAL SIGNS:  Blood pressure 118/70, heart rate  is 60 and regular.  Weight is 180 pounds.  GENERAL:  This is a mildly obese female, in no apparent distress.  NEUROLOGIC:  Alert and  oriented x3.  Normal affect.  NECK:  There is no JVD.  There is no thyromegaly or thyroid nodule.  LUNGS:  Clear to auscultation bilaterally.  CARDIOVASCULAR:  Heart regular S1 and S2.  No S2, no S4, no murmur.  There is no carotid bruit.  EXTREMITIES:  There is 1+ edema at the ankles.  The dorsalis pedis pulse  is 1+ in the right foot and 2+ in the left foot.  Posterior tibial  pulses is nonpalpable in the right foot and is 2+ in the left foot.  ABDOMEN:  Soft, nontender.  No hepatosplenomegaly.  Normal bowel sounds.  HEENT:  Normal exam.  SKIN:  Normal exam.  MUSCULOSKELETAL:  Normal exam.   ASSESSMENT AND PLAN:  This is an 77 year old with history of  hypertension, diabetes, and hyperlipidemia who presents to Cardiology  Clinic for evaluation prior to planned hysteroscopy for postmenopausal  vaginal bleeding.  1. Chest heaviness.  The patient does describe episodes of chest      heaviness that had been going on since this summer.  This tends to      happen after she climbs a flight stairs and she also sometimes gets      it at rest she says, but really the only thing that reproducibly      brings it on is when she climbs a flight of steps.  The chest      heaviness lasts a few minutes and resolves at rest.  This possibly      could be anginal chest pain, especially given her risk factors      which include diabetes, hypertension, and hyperlipidemia.  In light      of these symptoms, I think it will be reasonable to get an exercise      treadmill Myoview to assess for ischemia and further risk stratify      her.  She should continue on her aspirin 81 mg a day and she also      needs to continue on her beta-blocker.  I will, however, change her      beta-blocker from atenolol to metoprolol.  She will stop atenolol.      She will take Toprol-XL 50 mg a day.  I do not think atenolol is a      particularly  good drug for her.  Her creatinine is 1.2 and she is      77 years old.  Her GFR  is decreased and atenolol is renally      cleared, therefore, to prevent her from becoming too bradycardic, I      think it would be reasonable to have her on Toprol-XL instead of      atenolol.  2. Hyperlipidemia.  The patient's LDL is 130, goal LDL for the patient      for diabetes should be less than 100 and ideally less than 70.  I      am going to start her on 10 mg of Lipitor a day.  3. Hypertension.  The patient's blood pressure is under good control      on her current medications.  She is on angiotensin receptor      blocker.  4. Claudication type symptoms in the right calf with decreased pulses      in the right foot.  I am going to go ahead and obtain an arterial      Doppler ultrasound evaluation to assess for peripheral arterial      disease.  5. Preoperative evaluation.  As mentioned above, I am going to get an      exercise treadmill Myoview for this patient given her history of      some chest heaviness with exertion.  If this is negative or shows a      small amount of ischemia, The patient should be stable to go      forward with a procedure under general anesthesia.  She should      continue on her beta-blocker perioperatively given her risk      factors.  I would prefer her to be on Toprol-XL rather than      atenolol and my goal perioperative heart rate for her would be 50-      70.     Marca Ancona, MD  Electronically Signed    DM/MedQ  DD: 11/13/2008  DT: 11/14/2008  Job #: [id number]   cc:   Kerby Nora, MD  Jethro Bolus, MD  [facility]

## 2011-04-23 NOTE — Assessment & Plan Note (Signed)
[facility]                             [facility] OFFICE NOTE   NAME:Monica Hurst, Monica Hurst                      MRN:          [id number]  DATE:07/21/2006                            DOB:          August 03, 1924    CHIEF COMPLAINT:  This is a 77 year old black female here to establish with  a new doctor.   HISTORY OF PRESENT ILLNESS:  Ms. Risk states that she has been doing very  well over the past few years.  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  closer to her house.  Her previous doctor was Dr. Park Breed in [place].   PAST MEDICAL HISTORY:  1. Hypertension.  2. Diabetes.   HOSPITALIZATIONS/SURGERIES/PROCEDURES:  1. 2004, cholecystectomy.  2. 2006, DEXA scan normal.  3. 2006, colonoscopy within normal limits.   ALLERGIES:  No known drug allergies.   MEDICATIONS:  1. Avalide 300/12.5 mg daily.  2. Metformin 500 mg daily.  3. Furosemide 40 mg daily.  4. Atenolol 25 mg, 1 p.o. b.i.d.  5. Aspirin 81 mg daily.  6. Calcium 1 tablet daily.  7. Ex-Lax p.r.n. constipation.   REVIEW OF SYSTEMS:  Ms. Monica Hurst wears glasses and a wig.  She denies  headache, dizziness, syncope, hearing loss, or dentures.  She denies  shortness of breath, chest pain, or palpitations.  She denies nausea,  vomiting, diarrhea, constipation, or rectal bleeding.  She also denies  temperature intolerance, muscle aches and pains.  Positive bilateral nasal  pressure, rhinorrhea, and sneezing.  She has been on allergy medicine in the  past but is not taking any right now.   FAMILY HISTORY:  Father deceased at age 71 from old age.  Mother deceased  at age 75 with diabetes and hypertension.  She has five sisters, she is  unsure whether they have health problems.  She has two brothers, 1 who  passed away from diabetes and one who she is unsure about.  She denies  family history of ovarian, uterine, and  colon cancer.  She does have a  sister with breast cancer, unclear type.  She denies depression, alcohol,  and drug abuse.   SOCIAL HISTORY:  She lives in a large house along with her sister, her  nieces and nephews.  She has been widowed for about five years.  She has no  children of her own.  She gets some limited exercise weekly with stationary  bike riding about once per week.  As far as her diet, she eats three meals  with snacks each day.  She eats lots of vegetables and tries to avoid sugar.   PHYSICAL EXAMINATION:  VITAL SIGNS:  Height 64 inches.  Weight 201.  BMI 34.  Blood pressure 104/58.  Pulse 84.  Temperature 98.3.  GENERAL:  Elderly, overweight female in no apparent distress.  HEENT:  PERRLA.  Extraocular muscles intact.  Tympanic membranes clear.  Nares with inflammation of turbinates and possible bilateral nasal polyps.  Oropharynx clear.  Poor dentition.  No thyromegaly.  No cervical or  supraclavicular lymphadenopathy.  LUNGS:  Clear to auscultation bilaterally.  No wheezes, rales, or rhonchi.  CARDIOVASCULAR:  Regular rate and rhythm.  No murmurs, rubs, or gallops.  Normal PMI.  EXTREMITIES:  Pulses 2+ radial.  ABDOMEN:  Soft, nontender.  Normoactive bowel sounds.  No  hepatosplenomegaly.  MUSCULOSKELETAL:  Strength 5/5 in upper and lower extremities.  Some  decreased range of motion with shoulder abduction and hip internal and  external rotation.  She also has some bony growth in the first metacarpal on  both hands, likely secondary to osteoarthritis.  NEURO:  Alert and oriented x3.  Cranial nerves II through XII grossly  intact.  Reflexes 2+.  Sensation to light touch intact.   ASSESSMENT AND PLAN:  1. Allergic rhinitis, chronic:  We begin treatment with nasal steroid.      She is given a prescription for Nasonex to use one spray per nostril      daily.  She will return in 3-4 weeks to evaluate if this has decreased      the size of the nasal polyps and  inflammation on her nasal turbinates.  2. Diabetes, unclear control:  She is unsure of her hemoglobin A1c.  She      may also be due for creatinine or micro albumin.  I will obtain records      from her previous doctor.  3. Hypertension, well controlled:  She will continue to take Avalide,      Atenolol, and Furosemide.  It is unclear as to whether Furosemide is      for her blood pressure, peripheral swelling or if she has an element of      congestive heart failure.  I will obtain records from Dr. Park Breed.                                   Kerby Nora, MD   AB/MedQ  DD:  07/21/2006  DT:  07/21/2006  Job #:  [id number]

## 2011-05-22 ENCOUNTER — Other Ambulatory Visit: Payer: Self-pay | Admitting: Family Medicine

## 2011-05-25 ENCOUNTER — Encounter: Payer: Self-pay | Admitting: Cardiovascular Disease

## 2011-07-02 ENCOUNTER — Other Ambulatory Visit: Payer: Self-pay | Admitting: Family Medicine

## 2011-07-05 ENCOUNTER — Other Ambulatory Visit: Payer: Self-pay | Admitting: *Deleted

## 2011-07-05 MED ORDER — IRBESARTAN-HYDROCHLOROTHIAZIDE 300-12.5 MG PO TABS: 1.0000 | ORAL_TABLET | Freq: Every day | ORAL | Status: DC

## 2011-08-27 ENCOUNTER — Other Ambulatory Visit: Payer: Self-pay | Admitting: Family Medicine

## 2011-08-29 NOTE — Telephone Encounter (Signed)
This is a patient of Dr. Shirlee Latch. Thx

## 2011-09-01 ENCOUNTER — Other Ambulatory Visit: Payer: Self-pay | Admitting: Family Medicine

## 2011-09-03 LAB — COMPREHENSIVE METABOLIC PANEL
Albumin: 3.1 — ABNORMAL LOW
BUN: 15
Chloride: 105
Creatinine, Ser: 1.24 — ABNORMAL HIGH
Glucose, Bld: 108 — ABNORMAL HIGH
Total Bilirubin: 0.9
Total Protein: 5.8 — ABNORMAL LOW

## 2011-09-03 LAB — DIFFERENTIAL
Basophils Absolute: 0
Basophils Relative: 1
Lymphocytes Relative: 29
Monocytes Absolute: 0.3
Neutro Abs: 2.4
Neutrophils Relative %: 58

## 2011-09-03 LAB — CBC
HCT: 35.3 — ABNORMAL LOW
Hemoglobin: 11.3 — ABNORMAL LOW
MCV: 90.2
Platelets: 154
RDW: 14.6

## 2011-09-03 LAB — URINALYSIS, ROUTINE W REFLEX MICROSCOPIC
Bilirubin Urine: NEGATIVE
Ketones, ur: NEGATIVE
Nitrite: NEGATIVE
Specific Gravity, Urine: 1.01
Urobilinogen, UA: 1
pH: 6.5

## 2011-09-03 LAB — URINE MICROSCOPIC-ADD ON

## 2011-09-06 ENCOUNTER — Other Ambulatory Visit: Payer: Self-pay | Admitting: Family Medicine

## 2011-09-22 ENCOUNTER — Other Ambulatory Visit: Payer: Self-pay | Admitting: Family Medicine

## 2011-10-05 ENCOUNTER — Ambulatory Visit (INDEPENDENT_AMBULATORY_CARE_PROVIDER_SITE_OTHER): Payer: Medicare Other | Admitting: Family Medicine

## 2011-10-05 ENCOUNTER — Encounter: Payer: Self-pay | Admitting: Family Medicine

## 2011-10-05 VITALS — BP 132/80 | HR 60 | Temp 98.7°F | Wt 187.2 lb

## 2011-10-05 DIAGNOSIS — Z79899 Other long term (current) drug therapy: Secondary | ICD-10-CM

## 2011-10-05 DIAGNOSIS — Z23 Encounter for immunization: Secondary | ICD-10-CM

## 2011-10-05 DIAGNOSIS — J309 Allergic rhinitis, unspecified: Secondary | ICD-10-CM

## 2011-10-05 DIAGNOSIS — I10 Essential (primary) hypertension: Secondary | ICD-10-CM

## 2011-10-05 DIAGNOSIS — E538 Deficiency of other specified B group vitamins: Secondary | ICD-10-CM

## 2011-10-05 DIAGNOSIS — E119 Type 2 diabetes mellitus without complications: Secondary | ICD-10-CM

## 2011-10-05 DIAGNOSIS — E876 Hypokalemia: Secondary | ICD-10-CM

## 2011-10-05 DIAGNOSIS — E785 Hyperlipidemia, unspecified: Secondary | ICD-10-CM

## 2011-10-05 LAB — HEPATIC FUNCTION PANEL
AST: 20 U/L (ref 0–37)
Albumin: 3.3 g/dL — ABNORMAL LOW (ref 3.5–5.2)
Total Bilirubin: 0.4 mg/dL (ref 0.3–1.2)

## 2011-10-05 LAB — MICROALBUMIN / CREATININE URINE RATIO: Microalb, Ur: 3.1 mg/dL — ABNORMAL HIGH (ref 0.0–1.9)

## 2011-10-05 LAB — LIPID PANEL
Cholesterol: 168 mg/dL (ref 0–200)
LDL Cholesterol: 101 mg/dL — ABNORMAL HIGH (ref 0–99)
Triglycerides: 67 mg/dL (ref 0.0–149.0)
VLDL: 13.4 mg/dL (ref 0.0–40.0)

## 2011-10-05 LAB — BASIC METABOLIC PANEL
BUN: 15 mg/dL (ref 6–23)
CO2: 31 mEq/L (ref 19–32)
GFR: 61.35 mL/min (ref >60.00)
Glucose, Bld: 93 mg/dL (ref 70–99)
Potassium: 3.6 mEq/L (ref 3.5–5.1)
Sodium: 145 mEq/L (ref 135–145)

## 2011-10-05 MED ORDER — METFORMIN HCL ER 500 MG PO TB24: 500.0000 mg | ORAL_TABLET | Freq: Every day | ORAL | Status: DC

## 2011-10-05 MED ORDER — METOPROLOL TARTRATE 50 MG PO TABS: 50.0000 mg | ORAL_TABLET | Freq: Two times a day (BID) | ORAL | Status: DC

## 2011-10-05 MED ORDER — FLUTICASONE PROPIONATE 50 MCG/ACT NA SUSP: 2.0000 | Freq: Every day | NASAL | Status: DC

## 2011-10-05 MED ORDER — POTASSIUM CHLORIDE CRYS ER 20 MEQ PO TBCR: 20.0000 meq | EXTENDED_RELEASE_TABLET | Freq: Every day | ORAL | Status: DC

## 2011-10-05 MED ORDER — FUROSEMIDE 40 MG PO TABS: 40.0000 mg | ORAL_TABLET | Freq: Every day | ORAL | Status: DC

## 2011-10-05 MED ORDER — ATORVASTATIN CALCIUM 40 MG PO TABS: 40.0000 mg | ORAL_TABLET | Freq: Every day | ORAL | Status: DC

## 2011-10-05 MED ORDER — ISOSORBIDE MONONITRATE ER 30 MG PO TB24: 30.0000 mg | ORAL_TABLET | Freq: Every day | ORAL | Status: DC

## 2011-10-05 MED ORDER — CALCIUM CARBONATE-VITAMIN D 600-400 MG-UNIT PO TABS: 1.0000 | ORAL_TABLET | Freq: Two times a day (BID) | ORAL | Status: DC

## 2011-10-05 MED ORDER — VITAMIN B-12 1000 MCG PO TABS: 1000.0000 ug | ORAL_TABLET | Freq: Every day | ORAL | Status: DC

## 2011-10-05 MED ORDER — LOSARTAN POTASSIUM-HCTZ 100-12.5 MG PO TABS: 1.0000 | ORAL_TABLET | Freq: Every day | ORAL | Status: DC

## 2011-10-05 NOTE — Progress Notes (Signed)
Subjective:    Patient ID: Monica Hurst, female    DOB: [birth year], 77 y.o.   MRN: [id number]  HPI  Monica Hurst, a 77 y.o. female presents today in the office for the following:    Has been feeling well.  Ran out of Lasix.  No PCP OV in 18 months.  1. DIABETES MELLITUS, TYPE II :  Diabetes Mellitus: Tolerating Medications: Compliance with diet: ok Exercise: none Avg blood sugars at home: normal Foot problems: none Hypoglycemia: none No nausea, vomitting, blurred vision, polyuria.  Lab Results  Component Value Date   HGBA1C 6.3 02/10/2010    Basic Metabolic Panel:    Component Value Date/Time   NA 148* 02/10/2010 1208   K 3.6 02/10/2010 1208   CL 110 02/10/2010 1208   CO2 35* 02/10/2010 1208   BUN 18 02/10/2010 1208   CREATININE 1.2 02/10/2010 1208   GLUCOSE 99 02/10/2010 1208   CALCIUM 9.2 02/10/2010 1208     Basic metabolic panel, Hemoglobin A1c, Microalbumin / creatinine urine ratio, metFORMIN (GLUCOPHAGE-XR) 500 MG 24 hr tablet  2. VITAMIN B12 DEFICIENCY :  Lab Results  Component Value Date   VITAMINB12 200* 02/10/2010    Vitamin B12  3. HYPERLIPIDEMIA :  Lipids: Doing well, stable. Tolerating meds fine with no SE. Panel reviewed with patient.  Lipids:    Component Value Date/Time   CHOL 198 02/10/2010 1208   TRIG 113.0 02/10/2010 1208   HDL 64.40 02/10/2010 1208   VLDL 22.6 02/10/2010 1208   CHOLHDL 3 02/10/2010 1208    Lab Results  Component Value Date   ALT 12 02/10/2010   AST 15 02/10/2010   ALKPHOS 51 02/10/2010   BILITOT 0.6 02/10/2010    Lipid panel, atorvastatin (LIPITOR) 40 MG tablet  4. HYPERTENSION :  HTN: Tolerating all medications without side effects Stable and at goal No CP, no sob. No HA.  BP Readings from Last 3 Encounters:  10/05/11 132/80  02/06/10 130/80  08/18/09 139/84    Basic Metabolic Panel:    Component Value Date/Time   NA 148* 02/10/2010 1208   K 3.6 02/10/2010 1208   CL 110 02/10/2010 1208   CO2 35* 02/10/2010 1208   BUN 18 02/10/2010 1208    CREATININE 1.2 02/10/2010 1208   GLUCOSE 99 02/10/2010 1208   CALCIUM 9.2 02/10/2010 1208     losartan-hydrochlorothiazide (HYZAAR) 100-12.5 MG per tablet, metoprolol (LOPRESSOR) 50 MG tablet  5. Encounter for long-term (current) use of other medications  CBC with Differential, Hepatic function panel  6. Need for prophylactic vaccination and inoculation against influenza    7. ALLERGIC RHINITIS : needs medication refills   8. HYPOKALEMIA : asymptomatic, needs refills on KCl       The PMH, PSH, Social History, Family History, Medications, and allergies have been reviewed in [facility], and have been updated if relevant.   Review of Systems ROS: GEN: No acute illnesses, no fevers, chills. GI: No n/v/d, eating normally Pulm: No SOB Interactive and getting along well at home. Otherwise, the pertinent positives and negatives are listed above and in the HPI, otherwise a full review of systems has been reviewed and is negative unless noted positive.     Objective:   Physical Exam   Physical Exam  Blood pressure 132/80, pulse 60, temperature 98.7 F (37.1 C), temperature source Oral, weight 187 lb 4 oz (84.936 kg).  GEN: WDWN, NAD, Non-toxic, A & O x 3 HEENT: Atraumatic, Normocephalic. Neck supple. No masses,  No LAD. Ears and Nose: No external deformity. CV: RRR, No M/G/R. No JVD. No thrill. No extra heart sounds. PULM: CTA B, no wheezes, crackles, rhonchi. No retractions. No resp. distress. No accessory muscle use. ABD: S, NT, ND, +BS. No rebound tenderness. No HSM.  EXTR: No c/c/e NEURO Normal gait.  PSYCH: Normally interactive. Conversant. Not depressed or anxious appearing.  Calm demeanor.        Assessment & Plan:   1. DIABETES MELLITUS, TYPE II  Basic metabolic panel, Hemoglobin A1c, Microalbumin / creatinine urine ratio, metFORMIN (GLUCOPHAGE-XR) 500 MG 24 hr tablet  2. VITAMIN B12 DEFICIENCY  Vitamin B12  3. HYPERLIPIDEMIA  Lipid panel, atorvastatin (LIPITOR) 40 MG tablet  4.  HYPERTENSION  losartan-hydrochlorothiazide (HYZAAR) 100-12.5 MG per tablet, metoprolol (LOPRESSOR) 50 MG tablet  5. Encounter for long-term (current) use of other medications  CBC with Differential, Hepatic function panel  6. Need for prophylactic vaccination and inoculation against influenza    7. ALLERGIC RHINITIS    8. HYPOKALEMIA      I have done my best to refill all of her medications and order all appropriate testing for this patient who has not been seen in 18 months. Her current stability with all problems is unclear. Reviewed the importance of compliance with her.  Orders Placed This Encounter  Procedures  . Vitamin B12  . Basic metabolic panel  . CBC with Differential  . Hepatic function panel  . Lipid panel  . Hemoglobin A1c  . Microalbumin / creatinine urine ratio

## 2011-10-05 NOTE — Patient Instructions (Signed)
F/u next 3-6 months with Dr. Kerby Nora for Medicare Wellness exam (Full Physical)

## 2011-10-06 LAB — CBC WITH DIFFERENTIAL/PLATELET
Basophils Absolute: 0 10*3/uL (ref 0.0–0.1)
Eosinophils Absolute: 0.2 10*3/uL (ref 0.0–0.7)
HCT: 31.1 % — ABNORMAL LOW (ref 36.0–46.0)
Hemoglobin: 9.6 g/dL — ABNORMAL LOW (ref 12.0–15.0)
Lymphs Abs: 1 10*3/uL (ref 0.7–4.0)
MCHC: 31 g/dL (ref 30.0–36.0)
MCV: 84.8 fl (ref 78.0–100.0)
Monocytes Absolute: 0.3 10*3/uL (ref 0.1–1.0)
Neutro Abs: 2.1 10*3/uL (ref 1.4–7.7)
Platelets: 85 10*3/uL — ABNORMAL LOW (ref 150.0–400.0)
RDW: 15.6 % — ABNORMAL HIGH (ref 11.5–14.6)

## 2011-10-07 LAB — IBC PANEL
Iron: 29 ug/dL — ABNORMAL LOW (ref 42–145)
Transferrin: 276.6 mg/dL (ref 212.0–360.0)

## 2011-10-07 LAB — FERRITIN: Ferritin: 8.1 ng/mL — ABNORMAL LOW (ref 10.0–291.0)

## 2011-11-23 ENCOUNTER — Encounter: Payer: Self-pay | Admitting: Family Medicine

## 2011-11-24 ENCOUNTER — Telehealth: Payer: Self-pay | Admitting: Family Medicine

## 2011-11-24 NOTE — Telephone Encounter (Signed)
Monica Hurst, Can medical records send this letter out certified please?  Thanks, [employer]

## 2011-11-25 NOTE — Telephone Encounter (Addendum)
Letter sent out by certified/registered mail.  11/29/11- Received signed return receipt verifying delivery

## 2011-12-13 ENCOUNTER — Telehealth: Payer: Self-pay | Admitting: Family Medicine

## 2011-12-13 ENCOUNTER — Other Ambulatory Visit: Payer: Medicare Other

## 2011-12-13 DIAGNOSIS — E785 Hyperlipidemia, unspecified: Secondary | ICD-10-CM

## 2011-12-13 DIAGNOSIS — E119 Type 2 diabetes mellitus without complications: Secondary | ICD-10-CM

## 2011-12-13 DIAGNOSIS — D649 Anemia, unspecified: Secondary | ICD-10-CM

## 2011-12-13 NOTE — Telephone Encounter (Signed)
Message copied by Excell Seltzer on Mon Dec 13, 2011  8:18 AM ------      Message from: Baldomero Lamy      Created: Thu Dec 02, 2011  8:17 AM      Regarding: Cpx labs Mon 12/13/11       Please order  future cpx labs for pt's upcomming lab appt.      Thanks      Rodney Booze

## 2011-12-16 ENCOUNTER — Encounter: Payer: Medicare Other | Admitting: Family Medicine

## 2011-12-31 ENCOUNTER — Encounter: Payer: Medicare Other | Admitting: Family Medicine

## 2012-04-25 ENCOUNTER — Other Ambulatory Visit: Payer: Self-pay | Admitting: Family Medicine

## 2012-04-26 NOTE — Telephone Encounter (Signed)
Isnt patient seeing another doctor

## 2012-04-27 NOTE — Telephone Encounter (Signed)
Unable to contact patient.

## 2012-04-27 NOTE — Telephone Encounter (Signed)
I am not sure.. Had not been seen in 18 months when seen in 10/12 by Dr. Salena Saner.  Never returned for 3 month follow up with me. Let pt know no refills unleess seen regularly. Needs to make  appt before refills.

## 2012-05-22 ENCOUNTER — Other Ambulatory Visit: Payer: Self-pay | Admitting: Family Medicine

## 2012-05-22 NOTE — Telephone Encounter (Signed)
Received refill request electronically from pharmacy. See office note dated 10/05/11. Is it okay to refill medication?

## 2012-05-25 ENCOUNTER — Other Ambulatory Visit: Payer: Self-pay

## 2012-05-25 DIAGNOSIS — E119 Type 2 diabetes mellitus without complications: Secondary | ICD-10-CM

## 2012-05-25 MED ORDER — METFORMIN HCL ER 500 MG PO TB24: 500.0000 mg | ORAL_TABLET | Freq: Every day | ORAL | Status: DC

## 2012-05-25 MED ORDER — FUROSEMIDE 40 MG PO TABS: 40.0000 mg | ORAL_TABLET | Freq: Every day | ORAL | Status: DC

## 2012-05-25 NOTE — Telephone Encounter (Signed)
Pt request refill furosemide and metformin to [facility]; pt scheduled appt 06/22/12 for CPX.Pt out of med.Please advise.

## 2012-06-22 ENCOUNTER — Encounter: Payer: Medicare Other | Admitting: Family Medicine

## 2012-06-22 DIAGNOSIS — Z0289 Encounter for other administrative examinations: Secondary | ICD-10-CM

## 2012-10-10 ENCOUNTER — Telehealth: Payer: Self-pay

## 2012-10-10 NOTE — Telephone Encounter (Signed)
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 Monica Hurst wants pt to be seen but pt refuses.Please advise.

## 2012-10-10 NOTE — Telephone Encounter (Signed)
I have not seen her since 2009.. I have been seeing signs of dementia since even then but she refused medication. She definitely needs to be seen if not for that at least for her diabetes which can be contributing to her other health issues. She needs someone to come to appts with her.  She had poor control when my partner, Dr. Patsy Lager, saw once in 2012. See if daughter can convince her to come in for DM check up with labs prior instead of for dementia (we can then eval dementia when she gets here).  If she refuses and if she is thought to be of harm to her self or others... Contact police to bring her to the ER. If she is not at risk to harm herself or others in her HOME but still refuses to come... Can contact department of social services for home evaluation.

## 2012-10-11 NOTE — Telephone Encounter (Signed)
Patient niece is going to try to get patient to come in.

## 2012-10-21 ENCOUNTER — Other Ambulatory Visit: Payer: Self-pay | Admitting: Family Medicine

## 2012-11-20 ENCOUNTER — Other Ambulatory Visit: Payer: Self-pay | Admitting: Family Medicine

## 2012-11-24 ENCOUNTER — Other Ambulatory Visit: Payer: Self-pay | Admitting: *Deleted

## 2012-11-24 DIAGNOSIS — I10 Essential (primary) hypertension: Secondary | ICD-10-CM

## 2012-11-24 MED ORDER — METOPROLOL TARTRATE 50 MG PO TABS: 50.0000 mg | ORAL_TABLET | Freq: Two times a day (BID) | ORAL | Status: DC

## 2012-11-24 MED ORDER — LOSARTAN POTASSIUM-HCTZ 100-12.5 MG PO TABS: 1.0000 | ORAL_TABLET | Freq: Every day | ORAL | Status: DC

## 2012-11-27 ENCOUNTER — Ambulatory Visit (INDEPENDENT_AMBULATORY_CARE_PROVIDER_SITE_OTHER): Payer: Medicare Other | Admitting: Family Medicine

## 2012-11-27 ENCOUNTER — Encounter: Payer: Self-pay | Admitting: Family Medicine

## 2012-11-27 VITALS — BP 130/82 | HR 60 | Temp 97.9°F | Ht 66.0 in | Wt 178.5 lb

## 2012-11-27 DIAGNOSIS — C50919 Malignant neoplasm of unspecified site of unspecified female breast: Secondary | ICD-10-CM

## 2012-11-27 DIAGNOSIS — N950 Postmenopausal bleeding: Secondary | ICD-10-CM

## 2012-11-27 DIAGNOSIS — I10 Essential (primary) hypertension: Secondary | ICD-10-CM

## 2012-11-27 DIAGNOSIS — E119 Type 2 diabetes mellitus without complications: Secondary | ICD-10-CM

## 2012-11-27 DIAGNOSIS — E785 Hyperlipidemia, unspecified: Secondary | ICD-10-CM

## 2012-11-27 DIAGNOSIS — F068 Other specified mental disorders due to known physiological condition: Secondary | ICD-10-CM

## 2012-11-27 NOTE — Progress Notes (Signed)
Subjective:    Patient ID: Monica Hurst, female    DOB: 06-Sep-1924, 78 y.o.   MRN: [id number]  HPI Diabetes:  Due for re-eval. On metformin Lab Results  Component Value Date   HGBA1C 6.6* 10/05/2011  Using medications without difficulties: Hypoglycemic episodes:None Hyperglycemic episodes:None Feet problems:None Blood Sugars averaging:FBS under 100 eye exam within last year:No  Hypertension:  Well controlleld on hyzaar, metoprolol and imdur. LAsix for edema Using medication without problems or lightheadedness: None Chest pain with exertion:None Edema:None Short of breath:None Average home BPs: well controlled per pt at home, but unable to given numbers Other issues:  Elevated Cholesterol:  Due for re-eval.on atorvastatin Lab Results  Component Value Date   CHOL 168 10/05/2011   HDL 53.60 10/05/2011   LDLCALC 101* 10/05/2011   TRIG 67.0 10/05/2011   CHOLHDL 3 10/05/2011  Using medications without problems:None Muscle aches: None Diet compliance:Good, fruits and veggies, water Exercise:, walking daily Other complaints:  She is scheduled for CPX in next few weeks.  Hx of breast cancer, s/p surgery per pt.  She reports no further vaginal bleeding but unsure what endometrial biopsy results from a few years back were.   Review of Systems  Constitutional: Negative for fever, fatigue and unexpected weight change.  HENT: Negative for ear pain, congestion, sore throat, sneezing, trouble swallowing and sinus pressure.   Eyes: Negative for pain and itching.  Respiratory: Negative for cough, shortness of breath and wheezing.   Cardiovascular: Negative for chest pain, palpitations and leg swelling.  Gastrointestinal: Negative for nausea, abdominal pain, diarrhea, constipation and blood in stool.  Genitourinary: Negative for dysuria, hematuria, vaginal discharge, difficulty urinating and menstrual problem.  Skin: Negative for rash.  Neurological: Negative for syncope,  weakness, light-headedness, numbness and headaches.  Psychiatric/Behavioral: Negative for confusion and dysphoric mood. The patient is not nervous/anxious.        Objective:   Physical Exam  Constitutional: Vital signs are normal. She appears well-developed and well-nourished. She is cooperative.  Non-toxic appearance. She does not appear ill. No distress.       Elderly somewhat unkempt female in NAD  HENT:  Head: Normocephalic.  Right Ear: Hearing, tympanic membrane, external ear and ear canal normal. Tympanic membrane is not erythematous, not retracted and not bulging.  Left Ear: Hearing, tympanic membrane, external ear and ear canal normal. Tympanic membrane is not erythematous, not retracted and not bulging.  Nose: No mucosal edema or rhinorrhea. Right sinus exhibits no maxillary sinus tenderness and no frontal sinus tenderness. Left sinus exhibits no maxillary sinus tenderness and no frontal sinus tenderness.  Mouth/Throat: Uvula is midline, oropharynx is clear and moist and mucous membranes are normal.  Eyes: Conjunctivae normal, EOM and lids are normal. Pupils are equal, round, and reactive to light. No foreign bodies found.  Neck: Trachea normal and normal range of motion. Neck supple. Carotid bruit is not present. No mass and no thyromegaly present.  Cardiovascular: Normal rate, regular rhythm, S1 normal, S2 normal, normal heart sounds, intact distal pulses and normal pulses.  Exam reveals no gallop and no friction rub.   No murmur heard. Pulmonary/Chest: Effort normal and breath sounds normal. Not tachypneic. No respiratory distress. She has no decreased breath sounds. She has no wheezes. She has no rhonchi. She has no rales.  Abdominal: Soft. Normal appearance and bowel sounds are normal. There is no tenderness.  Neurological: She is alert.  Skin: Skin is warm, dry and intact. No rash noted.  Psychiatric: Her speech is  normal and behavior is normal. Judgment and thought content  normal. Her mood appears not anxious. Cognition and memory are normal. She does not exhibit a depressed mood.   Diabetic foot exam: Some deformity with bunions on inspection No skin breakdown B calluses  Normal DP pulses Normal sensation to light touch and monofilament Nails thickened        Assessment & Plan:

## 2012-11-27 NOTE — Patient Instructions (Addendum)
Go to eye MD once a year given diabetes. Return for fasting labs in next few weeks. Schedule on your way out., Keep your appt for your physical.

## 2012-12-17 ENCOUNTER — Other Ambulatory Visit: Payer: Self-pay | Admitting: Family Medicine

## 2012-12-27 NOTE — Assessment & Plan Note (Signed)
Well controlled. Continue current medication.  

## 2012-12-27 NOTE — Assessment & Plan Note (Signed)
Poor control. Pt denies issues. MMSE when comes in for CPX.

## 2012-12-27 NOTE — Assessment & Plan Note (Signed)
Will review / obtain past records to determine if complete treatment and eval performed. Pt not ware of endometrial biopsy results.

## 2012-12-27 NOTE — Assessment & Plan Note (Signed)
Due for re-eval. Encouraged exercise, weight loss, healthy eating habits.  

## 2012-12-27 NOTE — Assessment & Plan Note (Signed)
Due for re-eval. 

## 2012-12-27 NOTE — Assessment & Plan Note (Signed)
Will review / obtain past records to determine if complete treatment and eval performed.

## 2013-01-03 ENCOUNTER — Other Ambulatory Visit: Payer: Self-pay | Admitting: Family Medicine

## 2013-01-04 ENCOUNTER — Telehealth: Payer: Self-pay | Admitting: Family Medicine

## 2013-01-04 DIAGNOSIS — E785 Hyperlipidemia, unspecified: Secondary | ICD-10-CM

## 2013-01-04 DIAGNOSIS — D649 Anemia, unspecified: Secondary | ICD-10-CM

## 2013-01-04 DIAGNOSIS — E538 Deficiency of other specified B group vitamins: Secondary | ICD-10-CM

## 2013-01-04 DIAGNOSIS — E119 Type 2 diabetes mellitus without complications: Secondary | ICD-10-CM

## 2013-01-04 NOTE — Telephone Encounter (Signed)
Message copied by Excell Seltzer on Thu Jan 04, 2013  2:29 PM ------      Message from: Alvina Chou      Created: Wed Jan 03, 2013  1:35 PM      Regarding: lab orders for Thursday, Jan 11, 2013       Patient is scheduled for CPX labs, please order future labs, Thanks , Camelia Eng

## 2013-01-11 ENCOUNTER — Other Ambulatory Visit: Payer: Medicare Other

## 2013-01-18 ENCOUNTER — Encounter: Payer: Medicare Other | Admitting: Family Medicine

## 2013-01-29 ENCOUNTER — Other Ambulatory Visit: Payer: Self-pay | Admitting: Family Medicine

## 2013-01-30 NOTE — Telephone Encounter (Signed)
Make sure she has appt set for CPX.

## 2013-04-24 ENCOUNTER — Telehealth: Payer: Self-pay | Admitting: *Deleted

## 2013-04-24 NOTE — Telephone Encounter (Signed)
Family is concerned about pt's mental status, she is forgetful, gets lost easily, they are also concerned with her driving, she pulls out in front of people and they are not able to take her keys. She is very secretive towards her family, does not have any children and lives alone although family does check in on her. I advised they schedule an appointment and bring her in to address the concerns, but they do not think pt will do that. Is there anything else they can do?

## 2013-04-24 NOTE — Telephone Encounter (Signed)
I agree she has dementia issues and is not safe to drive. Remove battery from the car. In addition, they can call DMV and have them recall her for license re-evaluation given concerns of unsafe driving.. This is commonly done and is anonymous. Heather...can you call to have pt make appt at my request, she is overdue for a medicare wellness, labs prior... Maybe she will come then, but I need family to come with her as well.

## 2013-04-24 NOTE — Telephone Encounter (Signed)
Spoke with niece and she will call dmv. Got patient phone number but niece said patient leaves at 8 in the morning and stays gone all day.

## 2013-04-25 NOTE — Telephone Encounter (Signed)
Spoke with mrs. Monica Hurst and she said she is going out of town and will call for appt when she gets back

## 2013-05-01 NOTE — Telephone Encounter (Signed)
Spoke with niece and the family is going to get together to try and make patient come in to appt

## 2013-05-02 ENCOUNTER — Telehealth: Payer: Self-pay | Admitting: *Deleted

## 2013-05-02 NOTE — Telephone Encounter (Signed)
Faxed number for lawyer: [phone]  Fax for niece [phone]

## 2013-05-02 NOTE — Telephone Encounter (Signed)
Patients niece calling and says that she needs a note saying she has dementia and can not make decision.

## 2013-05-04 NOTE — Telephone Encounter (Signed)
She really needs to come in for a CPX... Plan at last OV was to do full mental Status exam at CPX when she returned but she never did. I cannot safely write letter without recent MMSE.  Call pt and have her come in for CPX ASAP, work her in. Say she needs to for any further refills.

## 2013-05-04 NOTE — Telephone Encounter (Signed)
Called patient but got no answer and no voice mail

## 2013-05-05 ENCOUNTER — Emergency Department: Payer: Self-pay | Admitting: Emergency Medicine

## 2013-05-14 NOTE — Telephone Encounter (Signed)
Spoke with patient and asked her to make appt to be seen but, patient says she is going out of town and that they are doing work on her home so patient can not come in until after all this is over with.   Advised patients niece via message on machine patient needs appt and we can not give letter until we have documentation of a mental status exam.   Advised her also to let us know if there is anything else we could help with to give [place] a call back

## 2013-06-15 ENCOUNTER — Other Ambulatory Visit (HOSPITAL_COMMUNITY): Payer: Self-pay | Admitting: Family Medicine

## 2013-06-15 ENCOUNTER — Ambulatory Visit (HOSPITAL_COMMUNITY)
Admission: RE | Admit: 2013-06-15 | Discharge: 2013-06-15 | Disposition: A | Discharge status: Home / Self Care | Payer: Medicare Other | Source: Ambulatory Visit | Attending: Family Medicine | Admitting: Family Medicine

## 2013-06-15 DIAGNOSIS — I498 Other specified cardiac arrhythmias: Secondary | ICD-10-CM | POA: Insufficient documentation

## 2013-06-15 DIAGNOSIS — R9431 Abnormal electrocardiogram [ECG] [EKG]: Secondary | ICD-10-CM | POA: Insufficient documentation

## 2013-06-26 ENCOUNTER — Other Ambulatory Visit: Payer: Self-pay | Admitting: Family Medicine

## 2013-07-07 ENCOUNTER — Other Ambulatory Visit: Payer: Self-pay | Admitting: Family Medicine

## 2014-01-24 ENCOUNTER — Emergency Department: Payer: Self-pay | Admitting: Emergency Medicine

## 2014-08-31 ENCOUNTER — Inpatient Hospital Stay: Payer: Self-pay | Admitting: Internal Medicine

## 2014-08-31 LAB — CBC WITH DIFFERENTIAL/PLATELET
Basophil #: 0 10*3/uL (ref 0.0–0.1)
Basophil %: 0.5 %
Eosinophil #: 0.2 10*3/uL (ref 0.0–0.7)
Eosinophil %: 2.6 %
HCT: 35.3 % (ref 35.0–47.0)
HGB: 9.9 g/dL — ABNORMAL LOW (ref 12.0–16.0)
Lymphocyte #: 0.7 10*3/uL — ABNORMAL LOW (ref 1.0–3.6)
Lymphocyte %: 8.2 %
MCH: 21.1 pg — AB (ref 26.0–34.0)
MCHC: 28.1 g/dL — AB (ref 32.0–36.0)
MCV: 75 fL — ABNORMAL LOW (ref 80–100)
MONO ABS: 0.6 x10 3/mm (ref 0.2–0.9)
Monocyte %: 7.5 %
NEUTROS ABS: 6.9 10*3/uL — AB (ref 1.4–6.5)
NEUTROS PCT: 81.2 %
PLATELETS: 88 10*3/uL — AB (ref 150–440)
RBC: 4.7 10*6/uL (ref 3.80–5.20)
RDW: 18.8 % — ABNORMAL HIGH (ref 11.5–14.5)
WBC: 8.5 10*3/uL (ref 3.6–11.0)

## 2014-08-31 LAB — COMPREHENSIVE METABOLIC PANEL
ALK PHOS: 95 U/L
Albumin: 2.8 g/dL — ABNORMAL LOW (ref 3.4–5.0)
Anion Gap: 7 (ref 7–16)
BILIRUBIN TOTAL: 1.7 mg/dL — AB (ref 0.2–1.0)
BUN: 30 mg/dL — AB (ref 7–18)
CO2: 29 mmol/L (ref 21–32)
CREATININE: 1.85 mg/dL — AB (ref 0.60–1.30)
Calcium, Total: 8.7 mg/dL (ref 8.5–10.1)
Chloride: 109 mmol/L — ABNORMAL HIGH (ref 98–107)
EGFR (African American): 33 — ABNORMAL LOW
GFR CALC NON AF AMER: 27 — AB
Glucose: 156 mg/dL — ABNORMAL HIGH (ref 65–99)
Osmolality: 298 (ref 275–301)
POTASSIUM: 4.1 mmol/L (ref 3.5–5.1)
SGOT(AST): 22 U/L (ref 15–37)
SGPT (ALT): 27 U/L
SODIUM: 145 mmol/L (ref 136–145)
Total Protein: 6.3 g/dL — ABNORMAL LOW (ref 6.4–8.2)

## 2014-08-31 LAB — CK-MB: CK-MB: 1 ng/mL (ref 0.5–3.6)

## 2014-08-31 LAB — TROPONIN I
Troponin-I: 0.04 ng/mL
Troponin-I: 0.04 ng/mL

## 2014-08-31 LAB — PRO B NATRIURETIC PEPTIDE: B-Type Natriuretic Peptide: 11883 pg/mL — ABNORMAL HIGH (ref 0–450)

## 2014-08-31 LAB — PROTIME-INR
INR: 1.1
PROTHROMBIN TIME: 14.3 s (ref 11.5–14.7)

## 2014-08-31 LAB — APTT: Activated PTT: 33.1 secs (ref 23.6–35.9)

## 2014-08-31 LAB — TSH: Thyroid Stimulating Horm: 2.63 u[IU]/mL

## 2014-09-01 DIAGNOSIS — I509 Heart failure, unspecified: Secondary | ICD-10-CM

## 2014-09-01 LAB — IRON AND TIBC
Iron Bind.Cap.(Total): 284 ug/dL (ref 250–450)
Iron Saturation: 6 %
Iron: 16 ug/dL — ABNORMAL LOW (ref 50–170)
Unbound Iron-Bind.Cap.: 268 ug/dL

## 2014-09-01 LAB — URINALYSIS, COMPLETE
BILIRUBIN, UR: NEGATIVE
Bacteria: NONE SEEN
Blood: NEGATIVE
GLUCOSE, UR: NEGATIVE mg/dL (ref 0–75)
Hyaline Cast: 3
KETONE: NEGATIVE
LEUKOCYTE ESTERASE: NEGATIVE
Nitrite: NEGATIVE
PROTEIN: NEGATIVE
Ph: 5 (ref 4.5–8.0)
RBC,UR: <1 /HPF (ref 0–5)
SPECIFIC GRAVITY: 1.005 (ref 1.003–1.030)
Squamous Epithelial: 1
WBC UR: 3 /HPF (ref 0–5)

## 2014-09-01 LAB — CBC WITH DIFFERENTIAL/PLATELET
Basophil #: 0.1 10*3/uL (ref 0.0–0.1)
Basophil %: 0.9 %
Eosinophil #: 0.4 10*3/uL (ref 0.0–0.7)
Eosinophil %: 4.6 %
HCT: 30.4 % — AB (ref 35.0–47.0)
HGB: 8.6 g/dL — ABNORMAL LOW (ref 12.0–16.0)
LYMPHS ABS: 1 10*3/uL (ref 1.0–3.6)
Lymphocyte %: 11.3 %
MCH: 21.1 pg — AB (ref 26.0–34.0)
MCHC: 28.2 g/dL — AB (ref 32.0–36.0)
MCV: 75 fL — ABNORMAL LOW (ref 80–100)
MONO ABS: 0.8 x10 3/mm (ref 0.2–0.9)
MONOS PCT: 8.9 %
NEUTROS PCT: 74.3 %
Neutrophil #: 6.8 10*3/uL — ABNORMAL HIGH (ref 1.4–6.5)
Platelet: 124 10*3/uL — ABNORMAL LOW (ref 150–440)
RBC: 4.07 10*6/uL (ref 3.80–5.20)
RDW: 18.8 % — AB (ref 11.5–14.5)
WBC: 9.1 10*3/uL (ref 3.6–11.0)

## 2014-09-01 LAB — LIPID PANEL
Cholesterol: 102 mg/dL (ref 0–200)
HDL: 31 mg/dL — AB (ref 40–60)
Ldl Cholesterol, Calc: 58 mg/dL (ref 0–100)
Triglycerides: 67 mg/dL (ref 0–200)
VLDL Cholesterol, Calc: 13 mg/dL (ref 5–40)

## 2014-09-01 LAB — COMPREHENSIVE METABOLIC PANEL
ALBUMIN: 2.5 g/dL — AB (ref 3.4–5.0)
ALT: 20 U/L
ANION GAP: 9 (ref 7–16)
AST: 20 U/L (ref 15–37)
Alkaline Phosphatase: 80 U/L
BILIRUBIN TOTAL: 1.1 mg/dL — AB (ref 0.2–1.0)
BUN: 30 mg/dL — ABNORMAL HIGH (ref 7–18)
CHLORIDE: 109 mmol/L — AB (ref 98–107)
Calcium, Total: 8.1 mg/dL — ABNORMAL LOW (ref 8.5–10.1)
Co2: 26 mmol/L (ref 21–32)
Creatinine: 1.79 mg/dL — ABNORMAL HIGH (ref 0.60–1.30)
GFR CALC AF AMER: 35 — AB
GFR CALC NON AF AMER: 28 — AB
Glucose: 144 mg/dL — ABNORMAL HIGH (ref 65–99)
Osmolality: 296 (ref 275–301)
POTASSIUM: 4.2 mmol/L (ref 3.5–5.1)
Sodium: 144 mmol/L (ref 136–145)
Total Protein: 5.6 g/dL — ABNORMAL LOW (ref 6.4–8.2)

## 2014-09-01 LAB — CK-MB: CK-MB: 0.9 ng/mL (ref 0.5–3.6)

## 2014-09-01 LAB — FERRITIN: Ferritin (ARMC): 19 ng/mL (ref 8–388)

## 2014-09-01 LAB — TROPONIN I: Troponin-I: 0.03 ng/mL

## 2014-09-01 LAB — OCCULT BLOOD X 1 CARD TO LAB, STOOL: Occult Blood, Feces: NEGATIVE

## 2014-09-02 DIAGNOSIS — I4892 Unspecified atrial flutter: Secondary | ICD-10-CM

## 2014-09-02 LAB — BASIC METABOLIC PANEL
ANION GAP: 7 (ref 7–16)
BUN: 29 mg/dL — ABNORMAL HIGH (ref 7–18)
CHLORIDE: 108 mmol/L — AB (ref 98–107)
CREATININE: 1.71 mg/dL — AB (ref 0.60–1.30)
Calcium, Total: 8.5 mg/dL (ref 8.5–10.1)
Co2: 30 mmol/L (ref 21–32)
EGFR (African American): 36 — ABNORMAL LOW
EGFR (Non-African Amer.): 30 — ABNORMAL LOW
Glucose: 105 mg/dL — ABNORMAL HIGH (ref 65–99)
Osmolality: 295 (ref 275–301)
Potassium: 3.9 mmol/L (ref 3.5–5.1)
SODIUM: 145 mmol/L (ref 136–145)

## 2014-09-02 LAB — CBC WITH DIFFERENTIAL/PLATELET
BASOS ABS: 0.1 10*3/uL (ref 0.0–0.1)
Basophil %: 1 %
EOS ABS: 0.5 10*3/uL (ref 0.0–0.7)
EOS PCT: 5.4 %
HCT: 31.7 % — AB (ref 35.0–47.0)
HGB: 9.1 g/dL — ABNORMAL LOW (ref 12.0–16.0)
Lymphocyte #: 1.3 10*3/uL (ref 1.0–3.6)
Lymphocyte %: 13.8 %
MCH: 21.5 pg — ABNORMAL LOW (ref 26.0–34.0)
MCHC: 28.8 g/dL — ABNORMAL LOW (ref 32.0–36.0)
MCV: 75 fL — ABNORMAL LOW (ref 80–100)
MONOS PCT: 9.1 %
Monocyte #: 0.8 x10 3/mm (ref 0.2–0.9)
NEUTROS PCT: 70.7 %
Neutrophil #: 6.4 10*3/uL (ref 1.4–6.5)
RBC: 4.25 10*6/uL (ref 3.80–5.20)
RDW: 18.6 % — ABNORMAL HIGH (ref 11.5–14.5)
WBC: 9.1 10*3/uL (ref 3.6–11.0)

## 2014-09-02 LAB — HEPARIN LEVEL (UNFRACTIONATED): Anti-Xa(Unfractionated): 1.05 IU/mL — ABNORMAL HIGH (ref 0.30–0.70)

## 2014-09-03 LAB — BASIC METABOLIC PANEL
Anion Gap: 4 — ABNORMAL LOW (ref 7–16)
BUN: 28 mg/dL — AB (ref 7–18)
CHLORIDE: 108 mmol/L — AB (ref 98–107)
CREATININE: 2.37 mg/dL — AB (ref 0.60–1.30)
Calcium, Total: 8.3 mg/dL — ABNORMAL LOW (ref 8.5–10.1)
Co2: 33 mmol/L — ABNORMAL HIGH (ref 21–32)
EGFR (African American): 25 — ABNORMAL LOW
EGFR (Non-African Amer.): 21 — ABNORMAL LOW
Glucose: 101 mg/dL — ABNORMAL HIGH (ref 65–99)
Osmolality: 294 (ref 275–301)
Potassium: 4.1 mmol/L (ref 3.5–5.1)
Sodium: 145 mmol/L (ref 136–145)

## 2014-09-04 LAB — BASIC METABOLIC PANEL
Anion Gap: 5 — ABNORMAL LOW (ref 7–16)
BUN: 38 mg/dL — ABNORMAL HIGH (ref 7–18)
CO2: 32 mmol/L (ref 21–32)
Calcium, Total: 8.4 mg/dL — ABNORMAL LOW (ref 8.5–10.1)
Chloride: 105 mmol/L (ref 98–107)
Creatinine: 3.07 mg/dL — ABNORMAL HIGH (ref 0.60–1.30)
EGFR (Non-African Amer.): 15 — ABNORMAL LOW
GFR CALC AF AMER: 19 — AB
Glucose: 117 mg/dL — ABNORMAL HIGH (ref 65–99)
OSMOLALITY: 293 (ref 275–301)
Potassium: 4.1 mmol/L (ref 3.5–5.1)
Sodium: 142 mmol/L (ref 136–145)

## 2014-09-05 DIAGNOSIS — I4892 Unspecified atrial flutter: Secondary | ICD-10-CM

## 2014-09-05 LAB — BASIC METABOLIC PANEL
ANION GAP: 5 — AB (ref 7–16)
BUN: 39 mg/dL — ABNORMAL HIGH (ref 7–18)
Calcium, Total: 8.3 mg/dL — ABNORMAL LOW (ref 8.5–10.1)
Chloride: 106 mmol/L (ref 98–107)
Co2: 31 mmol/L (ref 21–32)
Creatinine: 2.93 mg/dL — ABNORMAL HIGH (ref 0.60–1.30)
EGFR (African American): 20 — ABNORMAL LOW
EGFR (Non-African Amer.): 16 — ABNORMAL LOW
GLUCOSE: 85 mg/dL (ref 65–99)
OSMOLALITY: 292 (ref 275–301)
Potassium: 4 mmol/L (ref 3.5–5.1)
SODIUM: 142 mmol/L (ref 136–145)

## 2014-09-05 LAB — URIC ACID: Uric Acid: 8.1 mg/dL — ABNORMAL HIGH (ref 2.6–6.0)

## 2014-09-06 ENCOUNTER — Non-Acute Institutional Stay (SKILLED_NURSING_FACILITY): Payer: Medicare Other | Admitting: Adult Health

## 2014-09-06 DIAGNOSIS — I10 Essential (primary) hypertension: Secondary | ICD-10-CM

## 2014-09-06 DIAGNOSIS — I5041 Acute combined systolic (congestive) and diastolic (congestive) heart failure: Secondary | ICD-10-CM

## 2014-09-06 DIAGNOSIS — D649 Anemia, unspecified: Secondary | ICD-10-CM

## 2014-09-08 ENCOUNTER — Encounter: Payer: Self-pay | Admitting: Adult Health

## 2014-09-08 NOTE — Progress Notes (Signed)
Patient ID: Monica Hurst, female   DOB: Aug 10, 1924, 80 y.o.   MRN: [id number]     [facility]  No Known Allergies   Chief Complaint  Patient presents with  . Hospitalization Follow-up    HPI:  She has been hospitalized for new onset atrial flutter atrial fibrillation; acute heart failure and acute renal failure. She is here for short term rehab with her goal to return back home. She does live independently with her family close by. She will need education on diet prior to her being discharged to home.   Past Medical History  Diagnosis Date  . Breast cancer     right-sided. s/p lumpectomy in 7/09. that was followed by mammoSite adjuvant radiation treatment.   . Mild memory disturbances not amounting to dementia   . HTN (hypertension)   . Diabetes type 2, controlled     if controlled not specified  . Obesity   . Hx of cholecystectomy   . Fibroid uterus     w/postmenopausal vaginal bleeding  . History of echocardiogram 2/10    moderate LVH, EF 57-32%, mild diastolic dysfunction, normal RV size/fxn, ? RA mass.  Marland Kitchen MENORRHAGIA, POSTMENOPAUSAL 02/06/2010    Qualifier: Diagnosis of  By: Diona Browner MD, Amy    . ADENOCARCINOMA, BREAST 05/03/2008    Qualifier: Diagnosis of  By: Fuller Plan CMA (AAMA), Terri Skains      Past Surgical History  Procedure Laterality Date  . Abnormal myoview  12/09     lexiscan myoview showed EF 70%. no wall motion abnormalities. there was an inferior defect that was likely artifact. possibly some anterior ischemia  . Breast lumpectomy  7/09  . Vaginal hysterectomy  2010    for post menopausal bleeding    VITAL SIGNS BP 135/91  Pulse 64  Ht $R'5\' 3"'ga$  (1.6 m)  Wt 195 lb 6.4 oz (88.633 kg)  BMI 34.62 kg/m2  SpO2 99%   Patient's Medications  New Prescriptions   No medications on file  Previous Medications   AMIODARONE (PACERONE) 200 MG TABLET    Take 200 mg by mouth daily.   APIXABAN (ELIQUIS) 2.5 MG TABS TABLET    Take 2.5 mg by mouth 2 (two) times  daily.   FERROUS SULFATE 325 (65 FE) MG TABLET    Take 325 mg by mouth daily with breakfast.   INSULIN ASPART (NOVOLOG) 100 UNIT/ML INJECTION    Inject 0-10 Units into the skin 3 (three) times daily before meals. SSI: 150-200 2 units; 201-250: 4 units; 251-300 6 units; 301-350: 8 units; 351-400 10 units   METOPROLOL TARTRATE (LOPRESSOR) 25 MG TABLET    Take 12.5 mg by mouth 2 (two) times daily.  Modified Medications   No medications on file  Discontinued Medications   ASPIRIN 81 MG TABLET    Take 81 mg by mouth daily.     ATORVASTATIN (LIPITOR) 40 MG TABLET    Take 1 tablet (40 mg total) by mouth daily.   CALCIUM CARBONATE-VITAMIN D 600-400 MG-UNIT PER TABLET    Take 1 tablet by mouth every 12 (twelve) hours.   FLUTICASONE (FLONASE) 50 MCG/ACT NASAL SPRAY    Place 2 sprays into the nose daily.   FUROSEMIDE (LASIX) 40 MG TABLET    TAKE 1 TABLET BY MOUTH EVERY DAY   ISOSORBIDE MONONITRATE (IMDUR) 30 MG 24 HR TABLET    Take 1 tablet (30 mg total) by mouth daily.   LOSARTAN-HYDROCHLOROTHIAZIDE (HYZAAR) 100-12.5 MG PER TABLET    TAKE 1 TABLET  BY MOUTH ONCE A DAY   METFORMIN (GLUCOPHAGE-XR) 500 MG 24 HR TABLET    TAKE 1 TABLET BY MOUTH EVERY MORNING WITH BREAKFAST   METOPROLOL (LOPRESSOR) 50 MG TABLET    TAKE 1 TABLET BY MOUTH TWICE A DAY   POTASSIUM CHLORIDE SA (K-DUR,KLOR-CON) 20 MEQ TABLET    Take 1 tablet (20 mEq total) by mouth daily.   VITAMIN B-12 (CYANOCOBALAMIN) 1000 MCG TABLET    Take 1 tablet (1,000 mcg total) by mouth daily.    SIGNIFICANT DIAGNOSTIC EXAMS  08-31-14: chest x-ray: 1. Cardiomegaly; 2. bialteral lower lobe infiltrates or atelectasis and pleural effusions.   08-31-14: ct of head: no acute intracranial pathology  09-01-14: 2-d echo: LVEF 40-45%; severely dilated left and right atriums; moderate to severe tricuspid and mitral valve regurgitation. Severe increased left ventricular posterior wall thickness. No source of embolism identified.    LABS REVIEWED:   08-31-14: wbc  8.5; hgb 9.9; hct 35.3; mcv 75; plt 88 glucose 156; bun 30;c reat 1.85; k+4.1 na++145 alk phos 95; ast 22; alt 27; t bili 1.7; albumin 2.8  BNP 11883 tsh 2.63  09-01-14: wbc 9.1; hgb 8.6; hct 30.4; mcv 75; plt 124 iron 16; tibc 284; iron sat 6 chol 102; ldl 58; trig 67 09-03-14: glucose 101; bun 28; creat 2.37; k+4.1; na++ 145 09-04-14: glucose 117; bun 38; creat 3.07; k+4.1; na++142 09-05-14: glucose 85; bun 39; creat 2.93; k+4.0; na++ 142  Uric acid 8.1      Review of Systems  Constitutional: Positive for malaise/fatigue.       Fatigue is improving   Respiratory: Negative for cough and shortness of breath.   Cardiovascular: Positive for leg swelling. Negative for chest pain and palpitations.  Gastrointestinal: Negative for heartburn, abdominal pain and constipation.  Musculoskeletal: Negative for joint pain and myalgias.  Psychiatric/Behavioral: Negative for depression and suicidal ideas.    Physical Exam  Constitutional: She is oriented to person, place, and time. She appears well-developed and well-nourished. No distress.  Obese   Neck: Neck supple. No JVD present. No thyromegaly present.  Cardiovascular: Normal rate, normal heart sounds and intact distal pulses.   Heart rate irregular   Respiratory: Effort normal and breath sounds normal. No respiratory distress. She has no wheezes.  GI: Soft. Bowel sounds are normal. She exhibits no distension. There is no tenderness.  Musculoskeletal:  Is able to move all extremities Has 3+ lower extremity edema present.   Neurological: She is alert and oriented to person, place, and time.  Skin: Skin is warm and dry. She is not diaphoretic.      ASSESSMENT/ PLAN:  1. Acute combined systolic and diastolic heart failure: will place her on ted hose daily. Will continue her daily weights. Will need to consider adding lasix to her regimen in the future if she gains weight. Will have dietary give her education on low salt diet prior to her  discharge to home.   2. Afib/aflutter: she is presently stable; will continue her amiodarone 200 mg daily for her rate control and will continue eliquis 2.5 mg twice daily and will monitor her status  3. Hypertension: she is presently stable; will continue lopressor 12.5 mg twice daily and will monitor her status   4. Anemia: will continue iron daily will monitor   5. Diabetes: is presently stable will continue her novolog ssi at this time. Will make further adjustments as indicated.   6. Physical deconditioning: will continue therapy as directed to improve upon her strength;  mobility and independence with her adl's.    Will get a cbc and bmp   Time spent with patient 50 minutes.    Ok Edwards NP [facility]  Contact [phone] Monday through Friday 8am- 5pm  After hours call [phone]

## 2014-09-09 ENCOUNTER — Non-Acute Institutional Stay (SKILLED_NURSING_FACILITY): Payer: Medicare Other | Admitting: Internal Medicine

## 2014-09-09 DIAGNOSIS — R5381 Other malaise: Secondary | ICD-10-CM

## 2014-09-09 DIAGNOSIS — E118 Type 2 diabetes mellitus with unspecified complications: Secondary | ICD-10-CM

## 2014-09-09 DIAGNOSIS — I5043 Acute on chronic combined systolic (congestive) and diastolic (congestive) heart failure: Secondary | ICD-10-CM

## 2014-09-09 DIAGNOSIS — D509 Iron deficiency anemia, unspecified: Secondary | ICD-10-CM

## 2014-09-09 DIAGNOSIS — I480 Paroxysmal atrial fibrillation: Secondary | ICD-10-CM

## 2014-09-09 LAB — PROTEIN ELECTROPHORESIS(ARMC)

## 2014-09-09 NOTE — Progress Notes (Signed)
Patient ID: Melvyn Novas, female   DOB: [birth year], 80 y.o.   MRN: [id number]    Facility: [facility]   Chief Complaint  Patient presents with  . New Admit To SNF    new admission   No Known Allergies  HPI 80 y/o female patient is here for STR after hospital admission from 08/31/14- 09/05/14 with acute CHF exacerbation, atrial flutter, ARF. She was noted to have severe MR and TR on echocardiogram with EF of 40%. She responded well to diuresis. Her bp dropped with cardizem and was switched to low dose metoprolol. She was started on eliquis with cardiology consultation and cardioversion as outpatient was further recommended. Once euvolemic and medically stable, she has been discharged to SNF for rehabilitation.  She is seen in her room today. She is in no distress, has worked with therapy team and denies any concerns. She is sitting on her bed. No new concern from staff  Review of Systems  Constitutional: Negative for fever, chills, malaise/fatigue and diaphoresis.  HENT: Negative for congestion, hearing loss and sore throat.   Eyes: Negative for blurred vision, double vision and discharge.  Respiratory: Negative for cough, sputum production, shortness of breath and wheezing.   Cardiovascular: Negative for chest pain, palpitations, orthopnea. Has leg swelling.  Gastrointestinal: Negative for heartburn, nausea, vomiting, abdominal pain, diarrhea and constipation.  Genitourinary: Negative for dysuria, urgency, frequency and flank pain.  Musculoskeletal: Negative for back pain, falls, joint pain and myalgias.  Skin: Negative for itching and rash.  Neurological: Positive for weakness. Negative for dizziness, tingling, focal weakness and headaches.  Psychiatric/Behavioral: Negative for depression and memory loss. The patient is not nervous/anxious.    Reviewed past medical history, past surgical history- see chart for details    Medication List       This list  is accurate as of: 09/09/14  1:27 PM.  Always use your most recent med list.               amiodarone 200 MG tablet  Commonly known as:  PACERONE  Take 200 mg by mouth daily.     apixaban 2.5 MG Tabs tablet  Commonly known as:  ELIQUIS  Take 2.5 mg by mouth 2 (two) times daily.     ferrous fumarate 325 (106 FE) MG Tabs tablet  Commonly known as:  HEMOCYTE - 106 mg FE  Take 1 tablet by mouth daily.     insulin lispro 100 UNIT/ML injection  Commonly known as:  HUMALOG  Inject 0-10 Units into the skin 3 (three) times daily before meals.     metoprolol tartrate 12.5 mg Tabs tablet  Commonly known as:  LOPRESSOR  Take 12.5 mg by mouth 2 (two) times daily.       Social history- denies smoking or alcohol intake, lives at home  Family history- not contributory  Physical exam BP 129/72  Pulse 50  Temp(Src) 97 F (36.1 C)  Resp 19  General- elderly female in no acute distress Head- atraumatic, normocephalic Eyes- no pallor, no icterus, no discharge Neck- no lymphadenopathy Mouth- normal mucus membrane Cardiovascular- irregular heart rate, no murmurs, normal distal pulses Respiratory- bilateral clear to auscultation, basilar crackles,  no wheeze, no rhonchi Abdomen- bowel sounds present, soft, non tender Musculoskeletal- able to move all 4 extremities, normal range of motion, using wheelchair and walker, 2+ leg edema Neurological- no focal deficit, alert and oriented Skin- warm and dry Psychiatry-  normal mood and affect  imaging  08-31-14: chest x-ray: 1. Cardiomegaly; 2. bialteral lower lobe infiltrates or atelectasis and pleural effusions.  08-31-14: ct of head: no acute intracranial pathology  09-01-14: 2-d echo: LVEF 40-45%; severely dilated left and right atriums; moderate to severe tricuspid and mitral valve regurgitation. Severe increased left ventricular posterior wall thickness. No source of embolism identified.   labs 08-31-14: wbc 8.5; hgb 9.9; hct 35.3; mcv 75;  plt 88 glucose 156; bun 30;c reat 1.85; k+4.1 na++145 alk phos 95; ast 22; alt 27; t bili 1.7; albumin 2.8 BNP 11883 tsh 2.63  09-01-14: wbc 9.1; hgb 8.6; hct 30.4; mcv 75; plt 124 iron 16; tibc 284; iron sat 6 chol 102; ldl 58; trig 67  09-03-14: glucose 101; bun 28; creat 2.37; k+4.1; na++ 145  09-04-14: glucose 117; bun 38; creat 3.07; k+4.1; na++142  09-05-14: glucose 85; bun 39; creat 2.93; k+4.0; na++ 142 Uric acid 8.1   Assessment/plan  Physical deconditioning Will have her  work with physical therapy and occupational therapy team to help with gait training and muscle strengthening exercises.fall precautions. Skin care. Encourage to be out of bed.   CHF Recent exacerbation. Currently denies any symptoms but has edema and basilar crackles on exam. She is not wearing ted hose. Monitor daily weight and will have her on lasix 20 mg daily for now. Continue lopressor  Atrial fibrillation/ flutter Has irregular heart rate, continue lopressor and eliquis with amiodarone  Iron def anemia Continue ferrous fumarate and monitor h&h  Diabetes No recent a1c, on SSI with meals, monitor cbg, check a1c  Labs- cbc, cmp, a1c

## 2014-09-10 ENCOUNTER — Encounter: Payer: Self-pay | Admitting: Physician Assistant

## 2014-09-10 ENCOUNTER — Ambulatory Visit (INDEPENDENT_AMBULATORY_CARE_PROVIDER_SITE_OTHER): Payer: Medicare Other | Admitting: Physician Assistant

## 2014-09-10 VITALS — BP 117/76 | Ht 66.0 in | Wt 199.5 lb

## 2014-09-10 DIAGNOSIS — I5041 Acute combined systolic (congestive) and diastolic (congestive) heart failure: Secondary | ICD-10-CM

## 2014-09-10 DIAGNOSIS — I483 Typical atrial flutter: Secondary | ICD-10-CM

## 2014-09-10 DIAGNOSIS — I159 Secondary hypertension, unspecified: Secondary | ICD-10-CM

## 2014-09-10 DIAGNOSIS — Z79899 Other long term (current) drug therapy: Secondary | ICD-10-CM

## 2014-09-10 NOTE — Patient Instructions (Addendum)
We will draw labs today:  CBC, BMET  You are scheduled to see Dr. Lake Bells on 09/18/14 @ 11:30  You are scheduled to see Dr. Doneen Poisson at [facility] 09/24/14 @ 9:30  Please follow up with Christell Faith, PA on 10/19 or 10/20  If you are still in atrial flutter at that time, we can schedule you for a cardioversion to be done the next day

## 2014-09-10 NOTE — Progress Notes (Signed)
Patient Name: Monica Hurst, Monica Hurst 22-Apr-1924, MRN [id number]  Date of Encounter: 09/10/2014  Primary Care Provider:  Eliezer Lofts, MD Primary Cardiologist:  Dr. Fletcher Anon, MD  Patient Profile:  80 y.o. female with history of CAD, COPD, HTN, DM2, and newly diagnosed systolic CHF and atrial flutter this past admission to [facility] who is here for hospital follow up from her recent admission from 9/26-10/1.    Problem List:   Past Medical History  Diagnosis Date  . Breast cancer     right-sided. s/p lumpectomy in 7/09. that was followed by mammoSite adjuvant radiation treatment.   . Mild memory disturbances not amounting to dementia   . HTN (hypertension)   . Diabetes type 2, controlled     if controlled not specified  . Obesity   . Hx of cholecystectomy   . Fibroid uterus     w/postmenopausal vaginal bleeding  . MENORRHAGIA, POSTMENOPAUSAL 02/06/2010    Qualifier: Diagnosis of  By: Diona Browner MD, Amy    . COPD (chronic obstructive pulmonary disease)   . Atrial flutter     a. new onset 08/2014  . CAD (coronary artery disease)   . Chronic systolic CHF (congestive heart failure) 2/10    a. echo 01/2009: mod LVH, EF 53-61%, mild diastolic dysfunction, normal RV size/fxn, ? RA mass; b. echo 09/01/2014: EF 40-45%, severely dilated LA/RA, trival  global pericardial effusion, mod-severe MR, mod-severe TR,   . Kidney failure    Past Surgical History  Procedure Laterality Date  . Abnormal myoview  12/09     lexiscan myoview showed EF 70%. no wall motion abnormalities. there was an inferior defect that was likely artifact. possibly some anterior ischemia  . Breast lumpectomy  7/09  . Vaginal hysterectomy  2010    for post menopausal bleeding     Allergies:  No Known Allergies   HPI:  79 y.o. female with the above problem list who is here for hospital follow up after recent admission to [facility] from 9/26-10/1 for new onset systolic CHF and new onset atrial flutter.   Patient with  history of   She presented to [facility] on 9/26 with complaints of feeling weak last weak, especially legs. Lives at home alone. Does not use walker/cane  Still driving. [address] the week prior to her presentation to [facility]. Did not hit head  Noted increasing dyspnea and fatigue. In ER noted to have CHF and rapid afib/flutter. Improved with lasix and IV cardizem. Reviewed Echo EF 45% with severe biatrial enlargement. Moderate to severe MR and TR.  She inidicates never seeing a cardiologist before, however there are multiple EMR conversions for cardiology in Epic for her. No notes though. She has had no chest pain. Does not notice palpitations. No syncope. Despite "fall" last week she appears to be a candidate for anticoagulation. Has not fallen before and no bleeding diathesis. Work up included the above echo, ProBNP 11883, TnI negative x 3, TSH 2.63, K+ remained at approximately 4, hgb approximately 9, and a SCr that ranged from 1.71--3.07.   Review of her initial EKG and telemetry on when rounding on 9/29 revealed her rhythm was atrial flutter with initial rates of 130s - at that time HR in the 90s. Her diltiazem was discontinued 2/2 soft BP and she was changed to metoprolol 25 mg q 6 hours with hold parameters. We attempted to schedule TEE/cardioversion while inpatient but this could not be accommodated. It was felt that her new onset CHF was 2/2  her new onset atrial flutter, also with valvular disease. There was no evidence of an ischemic event. She received She did develop some acute renal insufficieny likely 2/2 ATN from low BP with the avoidance of nephrotoxic agents. Amiodarone 200 mg daily was added prior to discharge. She was continued on Eliquis 2.5 mg (Renal dose), and metoprolol.   Today she comes in stating she feels much better since being discharged from [facility] on 09/05/2014. Currently staying in a rest home. Still on atrial flutter today with HR of 81. BP 117/76. Tolerating medications without adverse  effects. Reports that she tries to be as active as possible. No chest pain, palpitations, SOB, DOE, nausea, presyncope, or syncope.    Home Medications:  Prior to Admission medications   Medication Sig Start Date End Date Taking? Authorizing Provider  amiodarone (PACERONE) 200 MG tablet Take 200 mg by mouth daily.    Historical Provider, MD  apixaban (ELIQUIS) 2.5 MG TABS tablet Take 2.5 mg by mouth 2 (two) times daily.    Historical Provider, MD  ferrous sulfate 325 (65 FE) MG tablet Take 325 mg by mouth daily with breakfast.    Historical Provider, MD  insulin aspart (NOVOLOG) 100 UNIT/ML injection Inject 0-10 Units into the skin 3 (three) times daily before meals. SSI: 150-200 2 units; 201-250: 4 units; 251-300 6 units; 301-350: 8 units; 351-400 10 units    Historical Provider, MD  metoprolol tartrate (LOPRESSOR) 25 MG tablet Take 12.5 mg by mouth 2 (two) times daily.    Historical Provider, MD     Weights: Filed Weights   09/10/14 1320  Weight: 199 lb 8 oz (90.493 kg)     Review of Systems:  All other systems reviewed and are otherwise negative except as noted above.  Physical Exam:  Blood pressure 117/76, height 5\' 6"  (1.676 m), weight 199 lb 8 oz (90.493 kg).  General: Pleasant, NAD Psych: Normal affect. Neuro: Alert and oriented X 3. Moves all extremities spontaneously. HEENT: Normal  Neck: Supple without bruits or JVD. Lungs:  Resp regular and unlabored, CTA. Heart: Irregular, no s3, s4, or murmurs. Abdomen: Soft, non-tender, non-distended, BS + x 4.  Extremities: No clubbing, cyanosis or edema. DP/PT/Radials 2+ and equal bilaterally.   Accessory Clinical Findings:  EKG - Atrial flutter with variable conduction, 81, diffuse nonspecific t wave abnormality   Assessment & Plan:  1. Atrial flutter: -Variable conduction rates -Rate controlled currently - 81 BPM -Asymptomatic  -Continue Eliquis 2.5 mg bid (renal dosing), amiodarone 200 mg daily (TSH and HFP  unremarkable) - advised patient to follow up with pulmonary and ophthalmology, & metoprolol 12.5 mg bid   -Check BMET -Follow up approximately week of 10/19 for recheck to verify if she is still in atrial flutter, if she remains in atrial flutter will schedule synchronized cardioversion followed by 3-4 weeks of anticoagulation therapy. If she has converted back to NSR to cardioversion is needed and she will continue current medication for the interim.     2. Chronic systolic CHF: -Likely 2/2 the above atrial flutter -Not currently volume overloaded  -Continue metoprolol 12.5 mg bid  -Avoid nephrotoxic agents 2/2 renal insufficieny sustained while inpatient (has hospital follow up with renal)  3. History of renal insufficiency:  -SCr bumped to 3.07 while inpatient at [facility] likely 2/2 ATN thought to be related to hypotension.  -Consulted on by Renal who discontinued nephrotoxic agents and advised on gentle hydration - SCr trended down to 2.93 prior to discharge.  -Not  nephrotoxic agents -Not volume overloaded currently   -Follow up outpatient labs are brought to me after the patient leaves which reveal SCr of 2.5 (10/2) & 1.9 (10/5)  4. History of iron deficiency anemia: -Check CBC -Hgb 10.1 on 10/2 -Continue ferrous sulfate  5. DM2: -Patient reports her blood sugars are well controlled -Follow up with PCP  6. Dispo: -Follow up week of 10/19 for recheck to verify if she is still in atrial flutter, if she remains in atrial flutter will schedule synchronized cardioversion followed by 3-4 weeks of anticoagulation therapy. If she has converted back to NSR to cardioversion is needed and she will continue current medication for the interim.  Christell Faith, PA-C [place] [place], [place] [address] [phone] [place] 09/10/2014, 1:54 PM

## 2014-09-18 ENCOUNTER — Ambulatory Visit (INDEPENDENT_AMBULATORY_CARE_PROVIDER_SITE_OTHER): Payer: Medicare Other | Admitting: Pulmonary Disease

## 2014-09-18 ENCOUNTER — Encounter: Payer: Self-pay | Admitting: Pulmonary Disease

## 2014-09-18 ENCOUNTER — Other Ambulatory Visit: Payer: Medicare Other

## 2014-09-18 VITALS — BP 126/74 | HR 55 | Ht 66.0 in | Wt 200.0 lb

## 2014-09-18 DIAGNOSIS — Z5181 Encounter for therapeutic drug level monitoring: Secondary | ICD-10-CM

## 2014-09-18 DIAGNOSIS — R0602 Shortness of breath: Secondary | ICD-10-CM

## 2014-09-18 DIAGNOSIS — I480 Paroxysmal atrial fibrillation: Secondary | ICD-10-CM

## 2014-09-18 DIAGNOSIS — I5022 Chronic systolic (congestive) heart failure: Secondary | ICD-10-CM

## 2014-09-18 DIAGNOSIS — I5041 Acute combined systolic (congestive) and diastolic (congestive) heart failure: Secondary | ICD-10-CM

## 2014-09-18 LAB — CBC WITH DIFFERENTIAL/PLATELET
Basophils Absolute: 0 10*3/uL (ref 0.0–0.1)
Basophils Relative: 0.6 % (ref 0.0–3.0)
EOS ABS: 0.5 10*3/uL (ref 0.0–0.7)
Eosinophils Relative: 7.1 % — ABNORMAL HIGH (ref 0.0–5.0)
HEMATOCRIT: 34.2 % — AB (ref 36.0–46.0)
Hemoglobin: 9.9 g/dL — ABNORMAL LOW (ref 12.0–15.0)
LYMPHS ABS: 0.9 10*3/uL (ref 0.7–4.0)
Lymphocytes Relative: 12.4 % (ref 12.0–46.0)
MCHC: 28.9 g/dL — AB (ref 30.0–36.0)
MCV: 77.3 fl — AB (ref 78.0–100.0)
MONO ABS: 0.5 10*3/uL (ref 0.1–1.0)
Monocytes Relative: 7 % (ref 3.0–12.0)
NEUTROS PCT: 72.9 % (ref 43.0–77.0)
Neutro Abs: 5.6 10*3/uL (ref 1.4–7.7)
PLATELETS: 161 10*3/uL (ref 150.0–400.0)
RBC: 4.43 Mil/uL (ref 3.87–5.11)
RDW: 25.1 % — ABNORMAL HIGH (ref 11.5–15.5)
WBC: 7.6 10*3/uL (ref 4.0–10.5)

## 2014-09-18 LAB — COMPREHENSIVE METABOLIC PANEL
ALBUMIN: 2.5 g/dL — AB (ref 3.5–5.2)
ALK PHOS: 66 U/L (ref 39–117)
ALT: 12 U/L (ref 0–35)
AST: 23 U/L (ref 0–37)
BILIRUBIN TOTAL: 0.8 mg/dL (ref 0.2–1.2)
BUN: 25 mg/dL — ABNORMAL HIGH (ref 6–23)
CO2: 32 mEq/L (ref 19–32)
Calcium: 8.5 mg/dL (ref 8.4–10.5)
Chloride: 101 mEq/L (ref 96–112)
Creatinine, Ser: 1.8 mg/dL — ABNORMAL HIGH (ref 0.4–1.2)
GFR: 34.9 mL/min — ABNORMAL LOW (ref >60.00)
GLUCOSE: 131 mg/dL — AB (ref 70–99)
POTASSIUM: 3.8 meq/L (ref 3.5–5.1)
SODIUM: 140 meq/L (ref 135–145)
TOTAL PROTEIN: 6.5 g/dL (ref 6.0–8.3)

## 2014-09-18 LAB — BRAIN NATRIURETIC PEPTIDE: PRO B NATRI PEPTIDE: 1791 pg/mL — AB (ref 0.0–100.0)

## 2014-09-18 NOTE — Assessment & Plan Note (Signed)
As noted above we will check a pulmonary function test and chest x-ray to make sure there is no evidence of underlying lung disease. We will also obtain a comprehensive metabolic panel, pro BNP, and CBC.

## 2014-09-18 NOTE — Patient Instructions (Signed)
We will arrange a pulmonary function test and a Chest X-ray at [facility] Keep your appointment with cardiology this week We will see you back in one month or sooner if needed

## 2014-09-18 NOTE — Assessment & Plan Note (Signed)
I have been asked to evaluate the patient for lung disease considering the fact that she is being treated with amiodarone. She has never been told that she had a lung problem in the past and the only significant cardiopulmonary problem I can find is this CHF with a flutter. She is a never smoker.  Objectively, she has normal oximetry with ambulation, crackles on exam which are consistent with her CHF, and she does appear to still be somewhat volume overloaded.  So at this point I do not have clear evidence of underlying lung disease that would be prohibitive to the use of amiodarone. However, her workup is incomplete.  Plan: -Obtain full pulmonary function testing -Obtain chest x-ray to ensure that there is resolution of pulmonary edema and no underlying lung disease -Followup 4 weeks

## 2014-09-18 NOTE — Progress Notes (Signed)
Subjective:    Patient ID: Monica Hurst, female    DOB: 09/29/1925, 80 y.o.   MRN: [id number]  HPI  This is a very pleasant 80 year old female who was recently hospitalized for acute systolic heart failure who comes to my clinic today for evaluation of her lung function. During that hospitalization she was noted to have a flutter and was started on amiodarone. While I do not have records from the hospitalization her most recent cardiology clinic note states that she is to be referred to [place] because of the amiodarone use. She tells me that she has been getting stronger since leaving the hospital and her shortness of breath has improved. She continues to have significant leg swelling. Her niece is with her it is particularly concerned about the leg swelling as well as the possibility of abnormal liver function.  The patient tells me that she has never smoked and has never been told that she has a lung disease. Interestingly her past medical history is significant for COPD but yet the patient has never been cold that. She tells me that she never had a significant respiratory illness as a child.  Apparently one year ago she had a very severe case of bronchitis but never required hospitalization. This was complicated by fever and significant sputum production.  Past Medical History  Diagnosis Date  . Breast cancer     right-sided. s/p lumpectomy in 7/09. that was followed by mammoSite adjuvant radiation treatment.   . Mild memory disturbances not amounting to dementia   . HTN (hypertension)   . Diabetes type 2, controlled     if controlled not specified  . Obesity   . Hx of cholecystectomy   . Fibroid uterus     w/postmenopausal vaginal bleeding  . MENORRHAGIA, POSTMENOPAUSAL 02/06/2010    Qualifier: Diagnosis of  By: Diona Browner MD, Amy    . COPD (chronic obstructive pulmonary disease)   . Atrial flutter     a. new onset 08/2014  . CAD (coronary artery disease)   . Chronic systolic CHF  (congestive heart failure) 2/10    a. echo 01/2009: mod LVH, EF 66-44%, mild diastolic dysfunction, normal RV size/fxn, ? RA mass; b. echo 09/01/2014: EF 40-45%, severely dilated LA/RA, trival  global pericardial effusion, mod-severe MR, mod-severe TR,   . Kidney failure      Family History  Problem Relation Age of Onset  . Coronary artery disease Neg Hx     no premature CAD  . Hypertension Mother   . Hypertension Father   . Cancer Sister     breast     History   Social History  . Marital Status: Married    Spouse Name: N/A    Number of Children: N/A  . Years of Education: N/A   Occupational History  . Not on file.   Social History Main Topics  . Smoking status: Never Smoker   . Smokeless tobacco: Never Used     Comment: does not smoke  . Alcohol Use: No  . Drug Use: No  . Sexual Activity: Not on file   Other Topics Concern  . Not on file   Social History Narrative   Pt lives in [place] with her sister. She has no children but does have nieces and nephews.      No Known Allergies   Outpatient Prescriptions Prior to Visit  Medication Sig Dispense Refill  . amiodarone (PACERONE) 200 MG tablet Take 200 mg by mouth daily.      Marland Kitchen  apixaban (ELIQUIS) 2.5 MG TABS tablet Take 2.5 mg by mouth 2 (two) times daily.      . ferrous sulfate 325 (65 FE) MG tablet Take 325 mg by mouth daily with breakfast.      . insulin aspart (NOVOLOG) 100 UNIT/ML injection Inject 0-10 Units into the skin 3 (three) times daily before meals. SSI: 150-200 2 units; 201-250: 4 units; 251-300 6 units; 301-350: 8 units; 351-400 10 units      . insulin lispro (HUMALOG) 100 UNIT/ML injection Inject 0-10 Units into the skin 3 (three) times daily before meals.      . metoprolol tartrate (LOPRESSOR) 12.5 mg TABS tablet Take 12.5 mg by mouth 2 (two) times daily.      Marland Kitchen amiodarone (PACERONE) 200 MG tablet Take 200 mg by mouth daily.      Marland Kitchen apixaban (ELIQUIS) 2.5 MG TABS tablet Take 2.5 mg by mouth 2 (two)  times daily.      . ferrous fumarate (HEMOCYTE - 106 MG FE) 325 (106 FE) MG TABS tablet Take 1 tablet by mouth daily.      . metoprolol tartrate (LOPRESSOR) 25 MG tablet Take 12.5 mg by mouth 2 (two) times daily.       No facility-administered medications prior to visit.      Review of Systems  Constitutional: Negative for fever and unexpected weight change.  HENT: Negative for congestion, dental problem, ear pain, nosebleeds, postnasal drip, rhinorrhea, sinus pressure, sneezing, sore throat and trouble swallowing.   Eyes: Negative for redness and itching.  Respiratory: Positive for shortness of breath. Negative for cough, chest tightness and wheezing.   Cardiovascular: Positive for leg swelling. Negative for palpitations.  Gastrointestinal: Negative for nausea and vomiting.  Genitourinary: Negative for dysuria.  Musculoskeletal: Negative for joint swelling.  Skin: Negative for rash.  Neurological: Negative for headaches.  Hematological: Does not bruise/bleed easily.  Psychiatric/Behavioral: Negative for dysphoric mood. The patient is not nervous/anxious.        Objective:   Physical Exam Filed Vitals:   09/18/14 1200  BP: 126/74  Pulse: 55  Height: 5\' 6"  (1.676 m)  Weight: 200 lb (90.719 kg)  SpO2: 98%   RA  Gen: chronically ill appearing, no acute distress HEENT: NCAT, PERRL, EOMi, OP clear, neck supple without masses PULM: Crackles in bases CV: RRR, no mgr, no JVD AB: BS+, soft, nontender, no hsm Ext: warm, significant leg edema, no clubbing, no cyanosis Derm: no rash or skin breakdown Neuro: A&Ox4, CN II-XII intact, MAEW  08/31/2014 chest x-ray reviewed: There are bilateral pleural effusions and bilateral atelectasis. There is cardiomegaly.      Assessment & Plan:   Acute combined systolic and diastolic congestive heart failure She seems to be recovering well from her recent hospitalization for acute on chronic systolic heart failure. She still has some leg  swelling today as well as crackles on exam. However, she had normal oximetry on ambulation.  Plan: -Continue care as outlined by cardiology -I reminded the patient and her niece to keep the appointment with cardiology next week  Paroxysmal atrial fibrillation I have been asked to evaluate the patient for lung disease considering the fact that she is being treated with amiodarone. She has never been told that she had a lung problem in the past and the only significant cardiopulmonary problem I can find is this CHF with a flutter. She is a never smoker.  Objectively, she has normal oximetry with ambulation, crackles on exam which are consistent with  her CHF, and she does appear to still be somewhat volume overloaded.  So at this point I do not have clear evidence of underlying lung disease that would be prohibitive to the use of amiodarone. However, her workup is incomplete.  Plan: -Obtain full pulmonary function testing -Obtain chest x-ray to ensure that there is resolution of pulmonary edema and no underlying lung disease -Followup 4 weeks  Encounter for therapeutic drug monitoring As noted above we will check a pulmonary function test and chest x-ray to make sure there is no evidence of underlying lung disease. We will also obtain a comprehensive metabolic panel, pro BNP, and CBC.    Updated Medication List Outpatient Encounter Prescriptions as of 09/18/2014  Medication Sig  . amiodarone (PACERONE) 200 MG tablet Take 200 mg by mouth daily.  Marland Kitchen apixaban (ELIQUIS) 2.5 MG TABS tablet Take 2.5 mg by mouth 2 (two) times daily.  . ferrous sulfate 325 (65 FE) MG tablet Take 325 mg by mouth daily with breakfast.  . insulin aspart (NOVOLOG) 100 UNIT/ML injection Inject 0-10 Units into the skin 3 (three) times daily before meals. SSI: 150-200 2 units; 201-250: 4 units; 251-300 6 units; 301-350: 8 units; 351-400 10 units  . insulin lispro (HUMALOG) 100 UNIT/ML injection Inject 0-10 Units into the  skin 3 (three) times daily before meals.  . metoprolol tartrate (LOPRESSOR) 12.5 mg TABS tablet Take 12.5 mg by mouth 2 (two) times daily.  . [DISCONTINUED] amiodarone (PACERONE) 200 MG tablet Take 200 mg by mouth daily.  . [DISCONTINUED] apixaban (ELIQUIS) 2.5 MG TABS tablet Take 2.5 mg by mouth 2 (two) times daily.  . [DISCONTINUED] ferrous fumarate (HEMOCYTE - 106 MG FE) 325 (106 FE) MG TABS tablet Take 1 tablet by mouth daily.  . [DISCONTINUED] metoprolol tartrate (LOPRESSOR) 25 MG tablet Take 12.5 mg by mouth 2 (two) times daily.

## 2014-09-18 NOTE — Assessment & Plan Note (Signed)
She seems to be recovering well from her recent hospitalization for acute on chronic systolic heart failure. She still has some leg swelling today as well as crackles on exam. However, she had normal oximetry on ambulation.  Plan: -Continue care as outlined by cardiology -I reminded the patient and her niece to keep the appointment with cardiology next week

## 2014-09-19 NOTE — Progress Notes (Signed)
Quick Note:  Spoke with pt and niece, they are aware of results and recs. Nothing further needed. ______

## 2014-09-23 ENCOUNTER — Non-Acute Institutional Stay (SKILLED_NURSING_FACILITY): Payer: Medicare Other | Admitting: Adult Health

## 2014-09-23 DIAGNOSIS — E118 Type 2 diabetes mellitus with unspecified complications: Secondary | ICD-10-CM

## 2014-09-23 DIAGNOSIS — I5041 Acute combined systolic (congestive) and diastolic (congestive) heart failure: Secondary | ICD-10-CM

## 2014-09-23 DIAGNOSIS — I10 Essential (primary) hypertension: Secondary | ICD-10-CM

## 2014-09-23 DIAGNOSIS — I480 Paroxysmal atrial fibrillation: Secondary | ICD-10-CM

## 2014-09-23 DIAGNOSIS — R5381 Other malaise: Secondary | ICD-10-CM

## 2014-09-23 NOTE — Progress Notes (Signed)
Patient Name: Monica Hurst, Monica Hurst [birth year], MRN [id number]  Date of Encounter: 09/25/2014  Primary Care Provider:  Eliezer Lofts, MD Primary Cardiologist:  Dr. Fletcher Anon, MD  Patient Profile:  80 y.o. female with history of COPD, HTN, DM2, and newly diagnosed systolic CHF and atrial flutter as of 08/2014 who is here for EKG check for possible synchronized cardioversion.      Problem List:   Past Medical History  Diagnosis Date  . Breast cancer     right-sided. s/p lumpectomy in 7/09. that was followed by mammoSite adjuvant radiation treatment.   . Mild memory disturbances not amounting to dementia   . HTN (hypertension)   . Diabetes type 2, controlled     if controlled not specified  . Obesity   . Hx of cholecystectomy   . Fibroid uterus     w/postmenopausal vaginal bleeding  . MENORRHAGIA, POSTMENOPAUSAL 02/06/2010    Qualifier: Diagnosis of  By: Diona Browner MD, Amy    . COPD (chronic obstructive pulmonary disease)   . Atrial flutter     a. new onset 08/2014  . Chronic systolic CHF (congestive heart failure) 2/10    a. echo 01/2009: mod LVH, EF 62-03%, mild diastolic dysfunction, normal RV size/fxn, ? RA mass; b. echo 09/01/2014: EF 40-45%, severely dilated LA/RA, trival  global pericardial effusion, mod-severe MR, mod-severe TR,   . Kidney failure    Past Surgical History  Procedure Laterality Date  . Abnormal myoview  12/09     lexiscan myoview showed EF 70%. no wall motion abnormalities. there was an inferior defect that was likely artifact. possibly some anterior ischemia  . Breast lumpectomy  7/09  . Vaginal hysterectomy  2010    for post menopausal bleeding     Allergies:  No Known Allergies   HPI:  80 y.o. female with the above problem list who presents for EKG review for possible cardioversion.   Patient with history above presents for routine office follow up for EKG review for possible cardioversion. Prior to her recent hospital admission in September  2015 she was without any known cardiac history. However on 9/26 she presented to [facility] with generalized weakness and palpitations. She was found to be atrial flutter with intermittent conduction. We were unable to exactly determine when she went into this rhythm and we were unable to schedule TEE/cardioversion while she was inpatient. She was placed on Lopressor 25 mg bid, loaded with amiodarone to a goal of 200 mg daily (currently on), and started on Eliquis 2.5 mg bid (renal dosing). Echo showed EF 45%, severe biatrial enlargement with a left atrium of 54 mm, moderate MR and TR. It was felt her cardiomyopathy was 2/2 her arrhythmia.  She did follow up with pulmonology who felt she was safe to continue amiodarone from a pulmonary standpoint - her follow up labs were improved from inpatient at that time.    She has done quite well at [facility] since her discharge. Her weight has remained stable. She will be leaving there today and going home. She would like to proceed with cardioversion.    Home Medications:  Prior to Admission medications   Medication Sig Start Date End Date Taking? Authorizing Provider  amiodarone (PACERONE) 200 MG tablet Take 200 mg by mouth daily.    Historical Provider, MD  apixaban (ELIQUIS) 2.5 MG TABS tablet Take 2.5 mg by mouth 2 (two) times daily.    Historical Provider, MD  ferrous sulfate 325 (65 FE) MG tablet  Take 325 mg by mouth daily with breakfast.    Historical Provider, MD  insulin aspart (NOVOLOG) 100 UNIT/ML injection Inject 0-10 Units into the skin 3 (three) times daily before meals. SSI: 150-200 2 units; 201-250: 4 units; 251-300 6 units; 301-350: 8 units; 351-400 10 units    Historical Provider, MD  insulin lispro (HUMALOG) 100 UNIT/ML injection Inject 0-10 Units into the skin 3 (three) times daily before meals.    Historical Provider, MD  metoprolol tartrate (LOPRESSOR) 12.5 mg TABS tablet Take 12.5 mg by mouth 2 (two) times daily.    Historical Provider, MD      Weights: Filed Weights   09/24/14 1341  Weight: 197 lb 8 oz (89.585 kg)     Review of Systems:  All other systems reviewed and are otherwise negative except as noted above.  Physical Exam:  Blood pressure 110/62, pulse 84, height 5\' 3"  (1.6 m), weight 197 lb 8 oz (89.585 kg).  General: Pleasant, NAD Psych: Normal affect. Neuro: Alert and oriented X 3. Moves all extremities spontaneously. HEENT: Normal  Neck: Supple without bruits or JVD. Lungs:  Resp regular and unlabored, CTA. Heart: Irregular, no s3, s4, 3/6 systolic mitral regurgitation murmur. Abdomen: Soft, non-tender, non-distended, BS + x 4.  Extremities: No clubbing, cyanosis or edema. DP/PT/Radials 2+ and equal bilaterally.   Accessory Clinical Findings:  EKG - atrial flutter with variable conduction vs coarse a-fib, 84, TWI V2-V5   Assessment & Plan:  1. Atrial flutter with variable conduction vs now coarse a-fib: -Still in arrhythmia -Scheduled for cardioversion 09/26/2014 (started on Eliquis 2.5 renal dosing on 09/02/2014 - has not missed a dose). Left atrium size 54 mm by echo 08/2014.  -She will continue apixaban 2.5 mg for 4 weeks post cardioversion  -No clear evidence of underlying lung disease that would be prohibitive to the use of amiodarone  -Continue Lopressor 12.5 mg bid & amiodarone 200 mg daily -Risk of the procedure of been explained to the patient in detail - she has made a sound decision   2. Chronic systolic CHF: -Likely 2/2 the above atrial flutter -She does not appear to be volume overloaded on exam  -Lasix does not appear to be on her medication list from [facility] - I have advised her to start Lasix 20 mg prn weight gain greater than 3 pounds  3. History of renal insufficiency: -SCr improved since in the hospital  -Lisinopril does not appear to be on the medication list from [facility] either, we have placed a call to ask them what meds she was actually taking and are awaiting a  call back -Given her baseline renal insufficiency I have held lisinopril until I hear from [facility]  4. History of iron deficiency anemia: -Hgb improved since in the hospital  -Per PCP  5. DM2: -Per PCP  6. Dispo: -Follow up 1 month post cardioversion    Christell Faith, PA-C [facility] [address] [place], [place] [id number] [phone] [place] 09/25/2014, 12:38 PM

## 2014-09-24 ENCOUNTER — Encounter: Payer: Self-pay | Admitting: Physician Assistant

## 2014-09-24 ENCOUNTER — Ambulatory Visit (INDEPENDENT_AMBULATORY_CARE_PROVIDER_SITE_OTHER): Payer: Medicare Other | Admitting: Physician Assistant

## 2014-09-24 VITALS — BP 110/62 | HR 84 | Ht 63.0 in | Wt 197.5 lb

## 2014-09-24 DIAGNOSIS — I483 Typical atrial flutter: Secondary | ICD-10-CM

## 2014-09-24 DIAGNOSIS — I10 Essential (primary) hypertension: Secondary | ICD-10-CM

## 2014-09-24 DIAGNOSIS — I5022 Chronic systolic (congestive) heart failure: Secondary | ICD-10-CM

## 2014-09-24 DIAGNOSIS — I480 Paroxysmal atrial fibrillation: Secondary | ICD-10-CM

## 2014-09-24 MED ORDER — AMIODARONE HCL 200 MG PO TABS: 200.0000 mg | ORAL_TABLET | Freq: Every day | ORAL | Status: DC

## 2014-09-24 MED ORDER — METOPROLOL TARTRATE 25 MG PO TABS: 12.5000 mg | ORAL_TABLET | Freq: Two times a day (BID) | ORAL | Status: DC

## 2014-09-24 MED ORDER — PRAVASTATIN SODIUM 20 MG PO TABS: 20.0000 mg | ORAL_TABLET | Freq: Every day | ORAL | Status: DC

## 2014-09-24 MED ORDER — APIXABAN 2.5 MG PO TABS: 2.5000 mg | ORAL_TABLET | Freq: Two times a day (BID) | ORAL | Status: DC

## 2014-09-24 MED ORDER — FUROSEMIDE 20 MG PO TABS: 20.0000 mg | ORAL_TABLET | ORAL | Status: DC | PRN

## 2014-09-24 NOTE — Progress Notes (Signed)
Patient ID: Monica Hurst, female   DOB: 1924/01/14, 80 y.o.   MRN: [id number]     [facility]  No Known Allergies   Chief Complaint  Patient presents with  . Discharge Note    HPI:  She is being discharged to home with home health for pt/ot/aid/rn. She will need a front wheel walker and she will need her prescriptions to be written. She will need to establish care in the [facility] clinic for pcp.  She had been hospitalized for acute combined systolic and diastolic heart failure; new onset afib; and renal failure.   Past Medical History  Diagnosis Date  . Breast cancer     right-sided. s/p lumpectomy in 7/09. that was followed by mammoSite adjuvant radiation treatment.   . Mild memory disturbances not amounting to dementia   . HTN (hypertension)   . Diabetes type 2, controlled     if controlled not specified  . Obesity   . Hx of cholecystectomy   . Fibroid uterus     w/postmenopausal vaginal bleeding  . MENORRHAGIA, POSTMENOPAUSAL 02/06/2010    Qualifier: Diagnosis of  By: Diona Browner MD, Amy    . COPD (chronic obstructive pulmonary disease)   . Atrial flutter     a. new onset 08/2014  . CAD (coronary artery disease)   . Chronic systolic CHF (congestive heart failure) 2/10    a. echo 01/2009: mod LVH, EF 90-24%, mild diastolic dysfunction, normal RV size/fxn, ? RA mass; b. echo 09/01/2014: EF 40-45%, severely dilated LA/RA, trival  global pericardial effusion, mod-severe MR, mod-severe TR,   . Kidney failure     Past Surgical History  Procedure Laterality Date  . Abnormal myoview  12/09     lexiscan myoview showed EF 70%. no wall motion abnormalities. there was an inferior defect that was likely artifact. possibly some anterior ischemia  . Breast lumpectomy  7/09  . Vaginal hysterectomy  2010    for post menopausal bleeding    VITAL SIGNS BP 110/60  Pulse 82  Ht _0  (1.6 m)  Wt 195 lb (88.451 kg)  BMI 34.55 kg/m2   Patient's Medications  New Prescriptions   No medications on file  Previous Medications   AMIODARONE (PACERONE) 200 MG TABLET    Take 1 tablet (200 mg total) by mouth daily.   APIXABAN (ELIQUIS) 2.5 MG TABS TABLET    Take 1 tablet (2.5 mg total) by mouth 2 (two) times daily.   FERROUS SULFATE 325 (65 FE) MG TABLET    Take 325 mg by mouth daily with breakfast.   FUROSEMIDE (LASIX) 20 MG TABLET    Take 1 tablet (20 mg total) by mouth as needed for fluid or edema (for wt gain > 3 lbs).   LISINOPRIL (PRINIVIL,ZESTRIL) 10 MG TABLET    Take 10 mg by mouth daily.    METFORMIN (GLUCOPHAGE-XR) 500 MG 24 HR TABLET    Take 500 mg by mouth daily with breakfast.    METOPROLOL TARTRATE (LOPRESSOR) 25 MG TABLET    Take 0.5 tablets (12.5 mg total) by mouth 2 (two) times daily.   PRAVASTATIN (PRAVACHOL) 20 MG TABLET    Take 1 tablet (20 mg total) by mouth daily.  Modified Medications   No medications on file  Discontinued Medications   No medications on file    SIGNIFICANT DIAGNOSTIC EXAMS  08-31-14: chest x-ray: 1. Cardiomegaly; 2. bialteral lower lobe infiltrates or atelectasis and pleural effusions.   08-31-14: ct of head: no acute  intracranial pathology  09-01-14: 2-d echo: LVEF 40-45%; severely dilated left and right atriums; moderate to severe tricuspid and mitral valve regurgitation. Severe increased left ventricular posterior wall thickness. No source of embolism identified.    LABS REVIEWED:   08-31-14: wbc 8.5; hgb 9.9; hct 35.3; mcv 75; plt 88 glucose 156; bun 30;c reat 1.85; k+4.1 na++145 alk phos 95; ast 22; alt 27; t bili 1.7; albumin 2.8  BNP 11883 tsh 2.63  09-01-14: wbc 9.1; hgb 8.6; hct 30.4; mcv 75; plt 124 iron 16; tibc 284; iron sat 6 chol 102; ldl 58; trig 67 09-03-14: glucose 101; bun 28; creat 2.37; k+4.1; na++ 145 09-04-14: glucose 117; bun 38; creat 3.07; k+4.1; na++142 09-05-14: glucose 85; bun 39; creat 2.93; k+4.0; na++ 142  Uric acid 8.1      Review of Systems  Constitutional: Positive for malaise/fatigue.        Fatigue is improving   Respiratory: Negative for cough and shortness of breath.   Cardiovascular: Positive for leg swelling. Negative for chest pain and palpitations.  Gastrointestinal: Negative for heartburn, abdominal pain and constipation.  Musculoskeletal: Negative for joint pain and myalgias.  Psychiatric/Behavioral: Negative for depression and suicidal ideas.    Physical Exam  Constitutional: She is oriented to person, place, and time. She appears well-developed and well-nourished. No distress.  Obese   Neck: Neck supple. No JVD present. No thyromegaly present.  Cardiovascular: Normal rate, normal heart sounds and intact distal pulses.   Heart rate irregular   Respiratory: Effort normal and breath sounds normal. No respiratory distress. She has no wheezes.  GI: Soft. Bowel sounds are normal. She exhibits no distension. There is no tenderness.  Musculoskeletal:  Is able to move all extremities Has 3+ lower extremity edema present.   Neurological: She is alert and oriented to person, place, and time.  Skin: Skin is warm and dry. She is not diaphoretic.      ASSESSMENT/ PLAN:  Will discharge her to home with home health for pt/ot/rn/aid to improve upon her strength; mobility; independence with adl's; disease and medication management with adl care. She will need a front wheel walker; her prescriptions have been written for a 30 day supply of her medications. She has a an appointment to establish care at [facility] with Dr. Bubba Camp on 11-26-14 at 10:15 am.    Time spent with patient 40 minutes.   Ok Edwards NP [facility]  Contact [phone] Monday through Friday 8am- 5pm  After hours call [phone]

## 2014-09-24 NOTE — Patient Instructions (Addendum)
Your physician has recommended that you have a Cardioversion (DCCV). Electrical Cardioversion uses a jolt of electricity to your heart either through paddles or wired patches attached to your chest. This is a controlled, usually prescheduled, procedure. Defibrillation is done under light anesthesia in the hospital, and you usually go home the day of the procedure. This is done to get your heart back into a normal rhythm. You are not awake for the procedure. Please see the instruction sheet given to you today.  [facility] Cardioversion Instructions   You are scheduled for a Cardioversion on:_____Thursday, Oct 22_________  Please arrive at __6:30_am on the day of your procedure  You will need to pre-register prior to the day of your procedure.  Enter through the [employer] at [facility].  Registration is the first desk on your right.  Please take the procedure order we have given you in order to be registered appropriately  Do not eat/drink anything after midnight  Someone will need to drive you home  Wear clothes that are easy to get on/off and wear slip on shoes if possible  Medications bring a current list of all medications with you  Day of your procedure: Arrive at the [facility] entrance.  Free valet service is available.  After entering the [facility] please check-in at the registration desk (1st desk on your right) to receive your armband. After receiving your armband someone will escort you to the cardiac cath/special procedures waiting area.  The usual length of stay after your procedure is about 2 to 3 hours.  This can vary.   Your physician recommends that you continue on your current medications as directed. Please refer to the Current Medication list given to you today.  Please follow up with Christell Faith, PA in 1 month

## 2014-09-25 ENCOUNTER — Encounter: Payer: Self-pay | Admitting: Physician Assistant

## 2014-09-26 ENCOUNTER — Inpatient Hospital Stay: Payer: Self-pay | Admitting: Specialist

## 2014-09-26 DIAGNOSIS — I5032 Chronic diastolic (congestive) heart failure: Secondary | ICD-10-CM

## 2014-09-26 DIAGNOSIS — I4891 Unspecified atrial fibrillation: Secondary | ICD-10-CM

## 2014-09-26 LAB — COMPREHENSIVE METABOLIC PANEL
ALT: 29 U/L
AST: 35 U/L (ref 15–37)
Albumin: 2.7 g/dL — ABNORMAL LOW (ref 3.4–5.0)
Alkaline Phosphatase: 100 U/L
Anion Gap: 6 — ABNORMAL LOW (ref 7–16)
BUN: 26 mg/dL — ABNORMAL HIGH (ref 7–18)
Bilirubin,Total: 0.9 mg/dL (ref 0.2–1.0)
CALCIUM: 8.7 mg/dL (ref 8.5–10.1)
CHLORIDE: 102 mmol/L (ref 98–107)
CO2: 36 mmol/L — AB (ref 21–32)
Creatinine: 1.84 mg/dL — ABNORMAL HIGH (ref 0.60–1.30)
EGFR (Non-African Amer.): 28 — ABNORMAL LOW
GFR CALC AF AMER: 33 — AB
Glucose: 105 mg/dL — ABNORMAL HIGH (ref 65–99)
Osmolality: 292 (ref 275–301)
Potassium: 3.5 mmol/L (ref 3.5–5.1)
Sodium: 144 mmol/L (ref 136–145)
Total Protein: 6.7 g/dL (ref 6.4–8.2)

## 2014-09-26 LAB — CK: CK, Total: 30 U/L

## 2014-09-26 LAB — CBC WITH DIFFERENTIAL/PLATELET
Basophil #: 0.1 10*3/uL (ref 0.0–0.1)
Basophil %: 1 %
EOS ABS: 0.7 10*3/uL (ref 0.0–0.7)
Eosinophil %: 11.4 %
HCT: 34.4 % — AB (ref 35.0–47.0)
HGB: 9.5 g/dL — ABNORMAL LOW (ref 12.0–16.0)
LYMPHS ABS: 0.7 10*3/uL — AB (ref 1.0–3.6)
LYMPHS PCT: 11.1 %
MCH: 22 pg — ABNORMAL LOW (ref 26.0–34.0)
MCHC: 27.8 g/dL — AB (ref 32.0–36.0)
MCV: 79 fL — ABNORMAL LOW (ref 80–100)
MONO ABS: 0.5 x10 3/mm (ref 0.2–0.9)
Monocyte %: 7.5 %
NEUTROS ABS: 4.4 10*3/uL (ref 1.4–6.5)
NEUTROS PCT: 69 %
PLATELETS: 167 10*3/uL (ref 150–440)
RBC: 4.33 10*6/uL (ref 3.80–5.20)
RDW: 25.7 % — ABNORMAL HIGH (ref 11.5–14.5)
WBC: 6.3 10*3/uL (ref 3.6–11.0)

## 2014-09-26 LAB — CK-MB: CK-MB: 1.2 ng/mL (ref 0.5–3.6)

## 2014-09-26 LAB — TROPONIN I: Troponin-I: 0.02 ng/mL

## 2014-09-27 LAB — BASIC METABOLIC PANEL
Anion Gap: 5 — ABNORMAL LOW (ref 7–16)
BUN: 23 mg/dL — AB (ref 7–18)
CALCIUM: 8.4 mg/dL — AB (ref 8.5–10.1)
CHLORIDE: 102 mmol/L (ref 98–107)
CO2: 35 mmol/L — AB (ref 21–32)
CREATININE: 1.6 mg/dL — AB (ref 0.60–1.30)
EGFR (Non-African Amer.): 32 — ABNORMAL LOW
GFR CALC AF AMER: 39 — AB
Glucose: 90 mg/dL (ref 65–99)
Osmolality: 286 (ref 275–301)
Potassium: 3.4 mmol/L — ABNORMAL LOW (ref 3.5–5.1)
Sodium: 142 mmol/L (ref 136–145)

## 2014-09-27 LAB — CBC WITH DIFFERENTIAL/PLATELET
BASOS ABS: 0 10*3/uL (ref 0.0–0.1)
Basophil %: 0.6 %
Eosinophil #: 0.7 10*3/uL (ref 0.0–0.7)
Eosinophil %: 12.8 %
HCT: 31.9 % — ABNORMAL LOW (ref 35.0–47.0)
HGB: 9.2 g/dL — AB (ref 12.0–16.0)
LYMPHS PCT: 12.4 %
Lymphocyte #: 0.7 10*3/uL — ABNORMAL LOW (ref 1.0–3.6)
MCH: 22.9 pg — ABNORMAL LOW (ref 26.0–34.0)
MCHC: 28.9 g/dL — AB (ref 32.0–36.0)
MCV: 79 fL — ABNORMAL LOW (ref 80–100)
MONO ABS: 0.5 x10 3/mm (ref 0.2–0.9)
Monocyte %: 9.9 %
Neutrophil #: 3.5 10*3/uL (ref 1.4–6.5)
Neutrophil %: 64.3 %
PLATELETS: 152 10*3/uL (ref 150–440)
RBC: 4.03 10*6/uL (ref 3.80–5.20)
RDW: 25.4 % — AB (ref 11.5–14.5)
WBC: 5.4 10*3/uL (ref 3.6–11.0)

## 2014-09-29 LAB — BASIC METABOLIC PANEL
Anion Gap: 6 — ABNORMAL LOW (ref 7–16)
BUN: 19 mg/dL — ABNORMAL HIGH (ref 7–18)
Calcium, Total: 8.3 mg/dL — ABNORMAL LOW (ref 8.5–10.1)
Chloride: 105 mmol/L (ref 98–107)
Co2: 33 mmol/L — ABNORMAL HIGH (ref 21–32)
Creatinine: 1.45 mg/dL — ABNORMAL HIGH (ref 0.60–1.30)
EGFR (African American): 44 — ABNORMAL LOW
EGFR (Non-African Amer.): 36 — ABNORMAL LOW
Glucose: 95 mg/dL (ref 65–99)
Osmolality: 289 (ref 275–301)
Potassium: 3.8 mmol/L (ref 3.5–5.1)
Sodium: 144 mmol/L (ref 136–145)

## 2014-09-29 LAB — CBC WITH DIFFERENTIAL/PLATELET
BASOS PCT: 1.5 %
Basophil #: 0.1 10*3/uL (ref 0.0–0.1)
EOS ABS: 0.9 10*3/uL — AB (ref 0.0–0.7)
EOS PCT: 15.8 %
HCT: 31.8 % — AB (ref 35.0–47.0)
HGB: 9 g/dL — ABNORMAL LOW (ref 12.0–16.0)
LYMPHS ABS: 0.7 10*3/uL — AB (ref 1.0–3.6)
Lymphocyte %: 11.7 %
MCH: 22.6 pg — ABNORMAL LOW (ref 26.0–34.0)
MCHC: 28.3 g/dL — ABNORMAL LOW (ref 32.0–36.0)
MCV: 80 fL (ref 80–100)
Monocyte #: 0.5 x10 3/mm (ref 0.2–0.9)
Monocyte %: 9.3 %
NEUTROS ABS: 3.5 10*3/uL (ref 1.4–6.5)
Neutrophil %: 61.7 %
PLATELETS: 99 10*3/uL — AB (ref 150–440)
RBC: 3.99 10*6/uL (ref 3.80–5.20)
RDW: 25.5 % — AB (ref 11.5–14.5)
WBC: 5.7 10*3/uL (ref 3.6–11.0)

## 2014-09-29 LAB — MAGNESIUM: MAGNESIUM: 1.8 mg/dL

## 2014-09-30 ENCOUNTER — Telehealth: Payer: Self-pay | Admitting: Cardiovascular Disease

## 2014-09-30 NOTE — Telephone Encounter (Signed)
1. Medications refilled except metformin this has been stopped 2/2 her renal function. She was given samples of her Eliquis.  2. Please have patient schedule follow up appointment so we can make determination of next step.  3. Patient needs a PCP they will have to manage her chronic medications and problems.

## 2014-09-30 NOTE — Telephone Encounter (Signed)
Pt is retaining fluid, and is not doing well, had procedure on Thursday and came home yesterday. Hospital said to make apt and also pharmacy also faxed [place] a request for her meds. Please call back. They are confused as to what to do, and pt isnt doing well.

## 2014-10-01 ENCOUNTER — Telehealth: Payer: Self-pay

## 2014-10-01 ENCOUNTER — Encounter: Payer: Self-pay | Admitting: *Deleted

## 2014-10-01 MED ORDER — LISINOPRIL 10 MG PO TABS: 10.0000 mg | ORAL_TABLET | Freq: Every day | ORAL | Status: DC

## 2014-10-01 MED ORDER — APIXABAN 2.5 MG PO TABS: 2.5000 mg | ORAL_TABLET | Freq: Two times a day (BID) | ORAL | Status: DC

## 2014-10-01 MED ORDER — FUROSEMIDE 20 MG PO TABS: 40.0000 mg | ORAL_TABLET | ORAL | Status: DC | PRN

## 2014-10-01 MED ORDER — FERROUS SULFATE 325 (65 FE) MG PO TABS: 325.0000 mg | ORAL_TABLET | Freq: Every day | ORAL | Status: DC

## 2014-10-01 MED ORDER — PRAVASTATIN SODIUM 20 MG PO TABS: 20.0000 mg | ORAL_TABLET | Freq: Every day | ORAL | Status: DC

## 2014-10-01 NOTE — Telephone Encounter (Signed)
Spoke with Michelene Heady with prior authorization which is approved. Notified pharmacist Eliquis 2.5 mg tablet one tablet twice a day has been approved; PA # is [id number] authorized through 10/01/2014.

## 2014-10-01 NOTE — Telephone Encounter (Signed)
Informed patient daughter of Ryans recommendations   Meds refilled  Samples left at desk   Patients daughter verbalized understanding

## 2014-10-01 NOTE — Telephone Encounter (Signed)
[name] pts niece left v/m requesting refills for pt; pt last saw [name] 11/27/2012. Left v/m requesting cb.

## 2014-10-01 NOTE — Telephone Encounter (Signed)
Monica Hurst called back and refills done by a different office. Monica Hurst said will make appt with [name]; [name] at front desk is scheduling appt.

## 2014-10-02 ENCOUNTER — Telehealth: Payer: Self-pay

## 2014-10-02 NOTE — Telephone Encounter (Signed)
Monica Hurst with [facility] left v/m; pt received cardioversion and hospitalist put in request for home health PT. Monica Hurst PT with [facility] wants to know after [name] sees pt on 10/04/14 will [name] sign PT orders for pt. Mary request cb.

## 2014-10-02 NOTE — Telephone Encounter (Signed)
Spoke with Glenetta Hew with [facility] and advised that as long as patient keeps her appointment with Dr. Diona Browner on Friday for her hospital follow up, that Dr. Diona Browner would sign her home health PT orders.

## 2014-10-04 ENCOUNTER — Encounter: Payer: Self-pay | Admitting: Family Medicine

## 2014-10-04 ENCOUNTER — Ambulatory Visit (INDEPENDENT_AMBULATORY_CARE_PROVIDER_SITE_OTHER): Payer: Medicare Other | Admitting: Family Medicine

## 2014-10-04 ENCOUNTER — Telehealth: Payer: Self-pay

## 2014-10-04 ENCOUNTER — Telehealth: Payer: Self-pay | Admitting: Family Medicine

## 2014-10-04 VITALS — BP 140/90 | HR 83 | Temp 97.4°F | Ht 63.0 in | Wt 201.0 lb

## 2014-10-04 DIAGNOSIS — I5022 Chronic systolic (congestive) heart failure: Secondary | ICD-10-CM

## 2014-10-04 DIAGNOSIS — E118 Type 2 diabetes mellitus with unspecified complications: Secondary | ICD-10-CM | POA: Diagnosis not present

## 2014-10-04 DIAGNOSIS — E538 Deficiency of other specified B group vitamins: Secondary | ICD-10-CM

## 2014-10-04 DIAGNOSIS — D638 Anemia in other chronic diseases classified elsewhere: Secondary | ICD-10-CM

## 2014-10-04 DIAGNOSIS — N183 Chronic kidney disease, stage 3 unspecified: Secondary | ICD-10-CM | POA: Insufficient documentation

## 2014-10-04 DIAGNOSIS — I5041 Acute combined systolic (congestive) and diastolic (congestive) heart failure: Secondary | ICD-10-CM

## 2014-10-04 DIAGNOSIS — E119 Type 2 diabetes mellitus without complications: Secondary | ICD-10-CM

## 2014-10-04 DIAGNOSIS — D509 Iron deficiency anemia, unspecified: Secondary | ICD-10-CM | POA: Diagnosis not present

## 2014-10-04 DIAGNOSIS — D510 Vitamin B12 deficiency anemia due to intrinsic factor deficiency: Secondary | ICD-10-CM | POA: Diagnosis not present

## 2014-10-04 DIAGNOSIS — E1122 Type 2 diabetes mellitus with diabetic chronic kidney disease: Secondary | ICD-10-CM | POA: Insufficient documentation

## 2014-10-04 DIAGNOSIS — N182 Chronic kidney disease, stage 2 (mild): Secondary | ICD-10-CM

## 2014-10-04 DIAGNOSIS — I10 Essential (primary) hypertension: Secondary | ICD-10-CM

## 2014-10-04 LAB — CBC WITH DIFFERENTIAL/PLATELET
BASOS ABS: 0 10*3/uL (ref 0.0–0.1)
Basophils Relative: 0.5 % (ref 0.0–3.0)
EOS PCT: 11 % — AB (ref 0.0–5.0)
Eosinophils Absolute: 0.6 10*3/uL (ref 0.0–0.7)
HEMATOCRIT: 32.7 % — AB (ref 36.0–46.0)
Hemoglobin: 9.4 g/dL — ABNORMAL LOW (ref 12.0–15.0)
LYMPHS ABS: 0.7 10*3/uL (ref 0.7–4.0)
Lymphocytes Relative: 11.8 % — ABNORMAL LOW (ref 12.0–46.0)
MCHC: 28.6 g/dL — ABNORMAL LOW (ref 30.0–36.0)
MCV: 81.1 fl (ref 78.0–100.0)
MONO ABS: 0.4 10*3/uL (ref 0.1–1.0)
MONOS PCT: 7.5 % (ref 3.0–12.0)
NEUTROS PCT: 69.2 % (ref 43.0–77.0)
Neutro Abs: 4 10*3/uL (ref 1.4–7.7)
RBC: 4.03 Mil/uL (ref 3.87–5.11)
RDW: 27.1 % — AB (ref 11.5–15.5)
WBC: 5.8 10*3/uL (ref 4.0–10.5)

## 2014-10-04 LAB — VITAMIN B12: Vitamin B-12: 296 pg/mL (ref 211–911)

## 2014-10-04 LAB — COMPREHENSIVE METABOLIC PANEL
ALBUMIN: 2.6 g/dL — AB (ref 3.5–5.2)
ALK PHOS: 69 U/L (ref 39–117)
ALT: 14 U/L (ref 0–35)
AST: 19 U/L (ref 0–37)
BILIRUBIN TOTAL: 0.9 mg/dL (ref 0.2–1.2)
BUN: 17 mg/dL (ref 6–23)
CO2: 29 mEq/L (ref 19–32)
Calcium: 8.8 mg/dL (ref 8.4–10.5)
Chloride: 106 mEq/L (ref 96–112)
Creatinine, Ser: 1.5 mg/dL — ABNORMAL HIGH (ref 0.4–1.2)
GFR: 42.47 mL/min — ABNORMAL LOW (ref >60.00)
GLUCOSE: 94 mg/dL (ref 70–99)
POTASSIUM: 4.2 meq/L (ref 3.5–5.1)
SODIUM: 143 meq/L (ref 135–145)
TOTAL PROTEIN: 6.3 g/dL (ref 6.0–8.3)

## 2014-10-04 LAB — IBC PANEL
Iron: 123 ug/dL (ref 42–145)
Saturation Ratios: 38.1 % (ref 20.0–50.0)
Transferrin: 230.6 mg/dL (ref 212.0–360.0)

## 2014-10-04 LAB — FERRITIN: Ferritin: 46.2 ng/mL (ref 10.0–291.0)

## 2014-10-04 LAB — HEMOGLOBIN A1C: Hgb A1c MFr Bld: 5.9 % (ref 4.6–6.5)

## 2014-10-04 LAB — FOLATE: Folate: 10.8 ng/mL (ref >5.9)

## 2014-10-04 NOTE — Telephone Encounter (Signed)
Olivia Mackie, RN with [name] left v/m requesting verbal order for social service consultant because does not have primary care giver in home and home health speech therapy for cognitive retraining; pt showing signs of dementia. Pt does not take BS as ordered and Tracy cannot find insulin in the home. Olivia Mackie request cb.

## 2014-10-04 NOTE — Progress Notes (Signed)
80 year old female presents for follow up. She has been lost to follow up in last 2 years.  She was hospitalized last week at [facility] for bradycardia after cardioversion.  recent history is as follows:   She was also hosptialized in 08/2014 for newly diagnosed CHF, aflutter.   She was in rehab a [facility] for 20 days.   She is followed by Dr. Idolina Primer at [facility]. Last OV reviewed, 09/24/2014 Prior to her recent hospital admission in September 2015 she was without any known cardiac history. However on 9/26 she presented to [facility] with generalized weakness and palpitations. She was found to be atrial flutter with intermittent conduction. We were unable to exactly determine when she went into this rhythm and we were unable to schedule TEE/cardioversion while she was inpatient. She was placed on Lopressor 25 mg bid, loaded with amiodarone to a goal of 200 mg daily (currently on), and started on Eliquis 2.5 mg bid (renal dosing). Echo showed EF 45%, severe biatrial enlargement with a left atrium of 54 mm, moderate MR and TR. It was felt her cardiomyopathy was 2/2 her arrhythmia.  Cardioversion 09/26/2014   Heart rate dropped and she was placed back in hospital on 10/22-10/25 Cardiogenic shock improved with dopamine. Amiodarone and BBlocker stopped and heart rate improved. Lasix 20 mg prn weight gain greater than 3 pounds  Metfomrin was held due to CR 1.84. Cr 1.45 at discharge.  She was also found to be anemic a admission Hg 9.0 also platelets low at 99. She has chronic anemia. She is on iron supplements.  She has cardiology follow up on 11/3 for hospital follow up.  She has been receiving PT.     Diabetes: Due for re-eval. off metformin  Due to CRF. Lab Results  Component Value Date   HGBA1C 6.6* 10/05/2011  Using medications without difficulties:  Hypoglycemic episodes:None  Hyperglycemic episodes:None  Feet problems:None  Blood Sugars averaging:not checking At  hospitalization 95-109 eye  exam within last year:No   Hypertension: Well controlleld on hyzaar, metoprolol and imdur. LAsix for edema  BP Readings from Last 3 Encounters:  10/04/14 140/90  09/24/14 110/62  09/23/14 110/60  Using medication without problems or lightheadedness: None  Chest pain with exertion:None  Edema:None  Short of breath:None  Average home BPs:  Checked by home health  Other issues:  Wt Readings from Last 3 Encounters:  10/04/14 201 lb (91.173 kg)  09/24/14 197 lb 8 oz (89.585 kg)  09/23/14 195 lb (88.451 kg)     Elevated Cholesterol: Due for re-eval.off atorvastatin   Lab Results  Component Value Date   CHOL 168 10/05/2011   HDL 53.60 10/05/2011   LDLCALC 101* 10/05/2011   TRIG 67.0 10/05/2011   CHOLHDL 3 10/05/2011  Using medications without problems:None  Muscle aches: None  Diet compliance:Good, fruits and veggies, water  Exercise:, walking daily  Other complaints:   Hx of breast cancer, s/p surgery per pt.  She reports no further vaginal bleeding but unsure what endometrial biopsy results from a few years back were.   [name] is niece: [phone] Vita Barley [phone]  Review of Systems  Constitutional: Negative for fever, fatigue and unexpected weight change.  HENT: Negative for ear pain, congestion, sore throat, sneezing, trouble swallowing and sinus pressure.  Eyes: Negative for pain and itching.  Respiratory: Negative for cough, shortness of breath and wheezing.  Cardiovascular: Negative for chest pain, palpitations and leg swelling.  Gastrointestinal: Negative for nausea, abdominal pain, diarrhea, constipation and  blood in stool.  Genitourinary: Negative for dysuria, hematuria, vaginal discharge, difficulty urinating and menstrual problem.  Skin: Negative for rash.  Neurological: Negative for syncope, weakness, light-headedness, numbness and headaches.  Psychiatric/Behavioral: Negative for confusion and dysphoric mood. The patient is not nervous/anxious.  Objective:    Physical Exam  Constitutional: Vital signs are normal. She appears well-developed and well-nourished. She is cooperative. Non-toxic appearance. She does not appear ill. No distress.  Elderly somewhat unkempt female in NAD  HENT:  Head: Normocephalic.  Right Ear: Hearing, tympanic membrane, external ear and ear canal normal. Tympanic membrane is not erythematous, not retracted and not bulging.  Left Ear: Hearing, tympanic membrane, external ear and ear canal normal. Tympanic membrane is not erythematous, not retracted and not bulging.  Nose: No mucosal edema or rhinorrhea. Right sinus exhibits no maxillary sinus tenderness and no frontal sinus tenderness. Left sinus exhibits no maxillary sinus tenderness and no frontal sinus tenderness.  Mouth/Throat: Uvula is midline, oropharynx is clear and moist and mucous membranes are normal.  Eyes: Conjunctivae normal, EOM and lids are normal. Pupils are equal, round, and reactive to light. No foreign bodies found.  Neck: Trachea normal and normal range of motion. Neck supple. Carotid bruit is not present. No mass and no thyromegaly present.  Cardiovascular: Normal rate, regular rhythm, S1 normal, S2 normal, normal heart sounds, intact distal pulses and normal pulses. Exam reveals no gallop and no friction rub.  No murmur heard.  Pulmonary/Chest: Effort normal and breath sounds normal. Not tachypneic. No respiratory distress. She has no decreased breath sounds. She has no wheezes. She has no rhonchi. She has no rales.  Abdominal: Soft. Normal appearance and bowel sounds are normal. There is no tenderness.  Neurological: She is alert.  Skin: Skin is warm, dry and intact. No rash noted.  Psychiatric: Her speech is normal and behavior is normal. Judgment and thought content normal. Her mood appears not anxious. Cognition and memory are abnormal. She does not exhibit a depressed mood.   Diabetic foot exam:  Some deformity with bunions on inspection  No skin  breakdown  B calluses  Normal DP pulses  Normal sensation to light touch and monofilament  Nails thickened

## 2014-10-04 NOTE — Telephone Encounter (Signed)
Pt & family stopped by at checkout to inquire about why she has an appt with [facility]. Pt states that she doesn't know why this visit was scheduled. She does not want to change Dr's and would like to keep [place] as her primary care. Pt and family are requesting a call back regarding this.

## 2014-10-04 NOTE — Telephone Encounter (Signed)
Monica Hurst notified as instructed by telephone.

## 2014-10-04 NOTE — Telephone Encounter (Signed)
Please given verbal order for social service consultant and speech therapy

## 2014-10-04 NOTE — Patient Instructions (Addendum)
Take a dose of furosemide today to help with swelling. Use it as needed. Stop at lab on way out. Follow up in 2 weeks, 30 min OV.

## 2014-10-04 NOTE — Telephone Encounter (Signed)
Left message for Tracy to return my call.

## 2014-10-04 NOTE — Telephone Encounter (Signed)
Okay to keep PCP here. Probably was scheduled by rehab staff at discharge. Have family call there to cancel appt.

## 2014-10-04 NOTE — Progress Notes (Signed)
Pre visit review using our clinic review tool, if applicable. No additional management support is needed unless otherwise documented below in the visit note. 

## 2014-10-07 ENCOUNTER — Telehealth: Payer: Self-pay | Admitting: Family Medicine

## 2014-10-07 ENCOUNTER — Encounter: Payer: Self-pay | Admitting: *Deleted

## 2014-10-07 NOTE — Telephone Encounter (Signed)
emmi mailed  °

## 2014-10-08 ENCOUNTER — Ambulatory Visit: Payer: Medicare Other | Admitting: Cardiovascular Disease

## 2014-10-08 ENCOUNTER — Encounter: Payer: Self-pay | Admitting: *Deleted

## 2014-10-08 ENCOUNTER — Telehealth: Payer: Self-pay

## 2014-10-08 NOTE — Telephone Encounter (Signed)
Barbaraann Barthel PT with [name] left v/m requesting verbal orders for home health PT 2 x a week for 4 weeks for balance, gait training, strength and fall prevention.Please advise.

## 2014-10-08 NOTE — Telephone Encounter (Signed)
Verbal orders given to Olivia Mackie at [facility] for [place] for Cognitive Retraining.

## 2014-10-08 NOTE — Telephone Encounter (Signed)
Verbal order given for [facility] PT 2 x week for 4 weeks for balance, gait training, strength and fall prevention.

## 2014-10-08 NOTE — Telephone Encounter (Signed)
Left message for Erin to return my call

## 2014-10-08 NOTE — Telephone Encounter (Signed)
Please provide verbal order as requested

## 2014-10-09 ENCOUNTER — Encounter: Payer: Self-pay | Admitting: *Deleted

## 2014-10-09 ENCOUNTER — Ambulatory Visit (INDEPENDENT_AMBULATORY_CARE_PROVIDER_SITE_OTHER): Payer: Medicare Other | Admitting: Cardiovascular Disease

## 2014-10-09 ENCOUNTER — Encounter: Payer: Self-pay | Admitting: Cardiovascular Disease

## 2014-10-09 VITALS — BP 148/96 | HR 75 | Ht 63.0 in | Wt 198.0 lb

## 2014-10-09 DIAGNOSIS — I4892 Unspecified atrial flutter: Secondary | ICD-10-CM | POA: Insufficient documentation

## 2014-10-09 DIAGNOSIS — I10 Essential (primary) hypertension: Secondary | ICD-10-CM

## 2014-10-09 DIAGNOSIS — I480 Paroxysmal atrial fibrillation: Secondary | ICD-10-CM

## 2014-10-09 DIAGNOSIS — I5022 Chronic systolic (congestive) heart failure: Secondary | ICD-10-CM

## 2014-10-09 MED ORDER — CARVEDILOL 3.125 MG PO TABS: 3.1250 mg | ORAL_TABLET | Freq: Two times a day (BID) | ORAL | Status: DC

## 2014-10-09 NOTE — Assessment & Plan Note (Signed)
Blood pressure is mildly elevated. Carvedilol was added.

## 2014-10-09 NOTE — Progress Notes (Signed)
HPI  80 y.o. female who is here today for a follow-up visit after recent hospitalization at [facility]. She has known history of COPD, hypertension, type 2 diabetes,chronic kidney disease, chronic systolic heart failure and recent diagnosis of atrial flutter. She was started on metoprolol, amiodarone and Eliquis. She underwent routine cardioversion on October 22 and converted to sinus rhythm. However, she had significant bradycardia with junctional rhythm in the 30s with hypotension. She was started on dopamine. Both metoprolol and amiodarone were discontinued.  Echo showed EF 45%, severe biatrial enlargement with a left atrium of 54 mm, moderate MR and TR. It was felt her cardiomyopathy was 2/2 her arrhythmia. She has been doing reasonably well and denies palpitations, or chest pain. She has chronic exertional dyspnea. She denies dizziness, syncope or presyncope.  No Known Allergies   Current Outpatient Prescriptions on File Prior to Visit  Medication Sig Dispense Refill  . apixaban (ELIQUIS) 2.5 MG TABS tablet Take 1 tablet (2.5 mg total) by mouth 2 (two) times daily. 60 tablet 6  . ferrous sulfate 325 (65 FE) MG tablet Take 1 tablet (325 mg total) by mouth daily with breakfast. 30 tablet 6  . furosemide (LASIX) 40 MG tablet Take 40 mg by mouth daily.    Marland Kitchen lisinopril (PRINIVIL,ZESTRIL) 10 MG tablet Take 1 tablet (10 mg total) by mouth daily. 30 tablet 6  . metFORMIN (GLUCOPHAGE) 500 MG tablet Take 500 mg by mouth daily with breakfast.    . pravastatin (PRAVACHOL) 20 MG tablet Take 20 mg by mouth daily.      No current facility-administered medications on file prior to visit.     Past Medical History  Diagnosis Date  . Breast cancer     right-sided. s/p lumpectomy in 7/09. that was followed by mammoSite adjuvant radiation treatment.   . Mild memory disturbances not amounting to dementia   . HTN (hypertension)   . Diabetes type 2, controlled     if controlled not specified  . Obesity     . Hx of cholecystectomy   . Fibroid uterus     w/postmenopausal vaginal bleeding  . MENORRHAGIA, POSTMENOPAUSAL 02/06/2010    Qualifier: Diagnosis of  By: Diona Browner MD, Amy    . COPD (chronic obstructive pulmonary disease)   . Atrial flutter     a. new onset 08/2014  . Chronic systolic CHF (congestive heart failure) 2/10    a. echo 01/2009: mod LVH, EF 01-60%, mild diastolic dysfunction, normal RV size/fxn, ? RA mass; b. echo 09/01/2014: EF 40-45%, severely dilated LA/RA, trival  global pericardial effusion, mod-severe MR, mod-severe TR,   . Kidney failure      Past Surgical History  Procedure Laterality Date  . Abnormal myoview  12/09     lexiscan myoview showed EF 70%. no wall motion abnormalities. there was an inferior defect that was likely artifact. possibly some anterior ischemia  . Breast lumpectomy  7/09  . Vaginal hysterectomy  2010    for post menopausal bleeding     Family History  Problem Relation Age of Onset  . Coronary artery disease Neg Hx     no premature CAD  . Hypertension Mother   . Hypertension Father   . Cancer Sister     breast     History   Social History  . Marital Status: Widowed    Spouse Name: N/A    Number of Children: N/A  . Years of Education: N/A   Occupational History  . Not on  file.   Social History Main Topics  . Smoking status: Never Smoker   . Smokeless tobacco: Never Used     Comment: does not smoke  . Alcohol Use: No  . Drug Use: No  . Sexual Activity: Not on file   Other Topics Concern  . Not on file   Social History Narrative   Pt lives in [place] with her sister. She has no children but does have nieces and nephews.       PHYSICAL EXAM   BP 148/96 mmHg  Pulse 75  Ht 5\' 3"  (1.6 m)  Wt 198 lb (89.812 kg)  BMI 35.08 kg/m2 Constitutional: She is oriented to person, place, and time. She appears frail. No distress.  HENT: No nasal discharge.  Head: Normocephalic and atraumatic.  Eyes: Pupils are equal and  round. No discharge.  Neck: Normal range of motion. Neck supple. No JVD present. No thyromegaly present.  Cardiovascular: Normal rate, regular rhythm, normal heart sounds. Exam reveals no gallop and no friction rub. No murmur heard.  Pulmonary/Chest: Effort normal and breath sounds normal. No stridor. No respiratory distress. She has no wheezes. She has no rales. She exhibits no tenderness.  Abdominal: Soft. Bowel sounds are normal. She exhibits no distension. There is no tenderness. There is no rebound and no guarding.  Musculoskeletal: Normal range of motion. She exhibits no edema and no tenderness.  Neurological: She is alert and oriented to person, place, and time. Coordination normal.  Skin: Skin is warm and dry. No rash noted. She is not diaphoretic. No erythema. No pallor.  Psychiatric: She has a normal mood and affect. Her behavior is normal. Judgment and thought content normal.     YTK:PTWSF rhythm with sinus arrhythmia, incomplete right bundle branch block with possible right ventricular hypertrophy.   ASSESSMENT AND PLAN

## 2014-10-09 NOTE — Assessment & Plan Note (Signed)
She is maintaining a normal sinus rhythm after recent successful cardioversion which was complicated by significant bradycardia. She probably has underlying sick sinus syndrome but seems to be stable at the present time without medications. I elected to start her on small dose carvedilol 3.125 mg twice daily given her mild cardiomyopathy and elevated blood pressure. Continue anticoagulation. If she develops significant bradycardia, permanent pacemaker might be needed in order to facilitate treatment of her tachyarrhythmia.

## 2014-10-09 NOTE — Patient Instructions (Signed)
Your physician has recommended you make the following change in your medication:  Start Coreg 3.125 mg twice daily    Your physician recommends that you schedule a follow-up appointment in:  2 weeks with Christell Faith PA  Your next appointment will be scheduled in our new office located at :  [facility]  [address], [place]  [place], [place] [address]

## 2014-10-09 NOTE — Assessment & Plan Note (Signed)
She appears to be euvolemic. Cardiomyopathy could be tachycardia induced. I added small dose carvedilol.

## 2014-10-11 ENCOUNTER — Encounter: Payer: Self-pay | Admitting: Physician Assistant

## 2014-10-14 ENCOUNTER — Ambulatory Visit: Payer: Self-pay | Admitting: Pulmonary Disease

## 2014-10-14 LAB — PULMONARY FUNCTION TEST

## 2014-10-16 ENCOUNTER — Telehealth: Payer: Self-pay

## 2014-10-16 DIAGNOSIS — J449 Chronic obstructive pulmonary disease, unspecified: Secondary | ICD-10-CM | POA: Insufficient documentation

## 2014-10-16 DIAGNOSIS — C50919 Malignant neoplasm of unspecified site of unspecified female breast: Secondary | ICD-10-CM | POA: Insufficient documentation

## 2014-10-16 DIAGNOSIS — Z9049 Acquired absence of other specified parts of digestive tract: Secondary | ICD-10-CM | POA: Insufficient documentation

## 2014-10-16 DIAGNOSIS — D259 Leiomyoma of uterus, unspecified: Secondary | ICD-10-CM | POA: Insufficient documentation

## 2014-10-16 NOTE — Telephone Encounter (Signed)
Left voicemail for Monica Hurst with verbal order for one additional Social Work visit as requested.

## 2014-10-16 NOTE — Telephone Encounter (Signed)
Monica Hurst is social worker with [name] left v/m requesting verbal order for 1 additional visit to speak with pt and niece about advanced directive, POA, and  Connecting pt with medical alarm around neck and getting pt on waiting list for services offered by the county.Please advise.

## 2014-10-16 NOTE — Telephone Encounter (Signed)
Monica Hurst with [name] notified to have niece give Monica Hurst 20 mg of Lasix now per Dr. Lorelei Pont.

## 2014-10-16 NOTE — Telephone Encounter (Signed)
Give 20 mg lasix now.

## 2014-10-16 NOTE — Telephone Encounter (Signed)
Olivia Mackie with [name] left v/m; pt's BP earlier today was 182/84 P 76; pt has plus 2 pitting edema in both lower extremeties; pt has prn order for lasix 20 mg for weight gain >than 3 lbs; but pt does not have a scale; Tracy request cb and if OK to give Lasix 20 mg pts niece will give the med today. Olivia Mackie will see pt again on 10/19/14.

## 2014-10-18 DIAGNOSIS — N19 Unspecified kidney failure: Secondary | ICD-10-CM | POA: Insufficient documentation

## 2014-10-18 DIAGNOSIS — I10 Essential (primary) hypertension: Secondary | ICD-10-CM | POA: Insufficient documentation

## 2014-10-18 DIAGNOSIS — I5022 Chronic systolic (congestive) heart failure: Secondary | ICD-10-CM | POA: Insufficient documentation

## 2014-10-18 DIAGNOSIS — E669 Obesity, unspecified: Secondary | ICD-10-CM | POA: Insufficient documentation

## 2014-10-21 ENCOUNTER — Telehealth: Payer: Self-pay

## 2014-10-21 NOTE — Telephone Encounter (Signed)
Spoke with pt's niece (E.C) who is aware of results and recs.  Pt scheduled for tomorrow at 11:00 to discuss results per BQ.  Nothing further needed at this time.

## 2014-10-21 NOTE — Telephone Encounter (Signed)
-----   Message from Juanito Doom, MD sent at 10/21/2014  1:37 PM EST -----  A,  Please let her know that her CXR showed some scarring in her lungs.  She needs to follow up with me to discuss this further.  Thanks B

## 2014-10-22 ENCOUNTER — Telehealth: Payer: Self-pay | Admitting: Pulmonary Disease

## 2014-10-22 ENCOUNTER — Encounter: Payer: Self-pay | Admitting: Family Medicine

## 2014-10-22 ENCOUNTER — Ambulatory Visit (INDEPENDENT_AMBULATORY_CARE_PROVIDER_SITE_OTHER): Payer: Medicare Other | Admitting: Family Medicine

## 2014-10-22 ENCOUNTER — Telehealth: Payer: Self-pay | Admitting: *Deleted

## 2014-10-22 ENCOUNTER — Ambulatory Visit (INDEPENDENT_AMBULATORY_CARE_PROVIDER_SITE_OTHER): Payer: Medicare Other | Admitting: Pulmonary Disease

## 2014-10-22 ENCOUNTER — Encounter: Payer: Self-pay | Admitting: Pulmonary Disease

## 2014-10-22 VITALS — BP 108/74 | HR 80 | Ht 63.0 in | Wt 170.8 lb

## 2014-10-22 VITALS — BP 143/87 | HR 80 | Temp 97.8°F | Ht 63.0 in | Wt 170.8 lb

## 2014-10-22 DIAGNOSIS — I10 Essential (primary) hypertension: Secondary | ICD-10-CM

## 2014-10-22 DIAGNOSIS — I5041 Acute combined systolic (congestive) and diastolic (congestive) heart failure: Secondary | ICD-10-CM

## 2014-10-22 DIAGNOSIS — N183 Chronic kidney disease, stage 3 unspecified: Secondary | ICD-10-CM

## 2014-10-22 DIAGNOSIS — I4892 Unspecified atrial flutter: Secondary | ICD-10-CM

## 2014-10-22 DIAGNOSIS — E118 Type 2 diabetes mellitus with unspecified complications: Secondary | ICD-10-CM

## 2014-10-22 DIAGNOSIS — F0390 Unspecified dementia without behavioral disturbance: Secondary | ICD-10-CM

## 2014-10-22 DIAGNOSIS — J449 Chronic obstructive pulmonary disease, unspecified: Secondary | ICD-10-CM

## 2014-10-22 DIAGNOSIS — F03B0 Unspecified dementia, moderate, without behavioral disturbance, psychotic disturbance, mood disturbance, and anxiety: Secondary | ICD-10-CM

## 2014-10-22 MED ORDER — BAYER MICROLET LANCETS MISC: Status: AC

## 2014-10-22 MED ORDER — GLUCOSE BLOOD VI STRP: ORAL_STRIP | Status: AC

## 2014-10-22 MED ORDER — DONEPEZIL HCL 5 MG PO TABS: 5.0000 mg | ORAL_TABLET | Freq: Every day | ORAL | Status: DC

## 2014-10-22 NOTE — Patient Instructions (Signed)
We will contact you when we have seen the result of the lung function test Otherwise, follow up with [place] as needed

## 2014-10-22 NOTE — Assessment & Plan Note (Signed)
Will call [facility] to find pout what CBGs are running off metformin.

## 2014-10-22 NOTE — Telephone Encounter (Signed)
Noted  

## 2014-10-22 NOTE — Progress Notes (Signed)
Pre visit review using our clinic review tool, if applicable. No additional management support is needed unless otherwise documented below in the visit note. 

## 2014-10-22 NOTE — Addendum Note (Signed)
Addended by: Carter Kitten on: 10/22/2014 02:24 PM   Modules accepted: Orders

## 2014-10-22 NOTE — Assessment & Plan Note (Signed)
Will re-eval today at her lower weight to make sure not overdiuresed.

## 2014-10-22 NOTE — Assessment & Plan Note (Signed)
She was initially referred to me for evaluation of her risk for using amiodarone for a flutter. Fortunately, she is no longer using amiodarone. She does have a slightly abnormal chest x-ray which has not changed in over 6 years. I suspect the mild scarring on her chest x-ray is related to an old infection.  At this point I see no further indication for followup with the pulmonary clinic. We will call her with the results of the lung function test if they are abnormal.

## 2014-10-22 NOTE — Progress Notes (Signed)
Subjective:    Patient ID: Monica Hurst, female    DOB: 03/06/1924, 80 y.o.   MRN: [id number]  HPI   Chief Complaint  Patient presents with  . Follow-up    Patient here today to review cxr. Patient denies any symptoms today of cough, wheeze, chest tightness and or chest tightness.    10/22/2014 ROV > Ms. Mcglaun was hospitalized for atrial fibrillation about 2-3 weks ago.  She was cardioverted at the hospital and converted to [place].  She has been doing OK since then.  She says that her breathing has been good lately.  Her leg swelling has been improving recnetly.  She is not coughing up mucus.    Past Medical History  Diagnosis Date  . Breast cancer     right-sided. s/p lumpectomy in 7/09. that was followed by mammoSite adjuvant radiation treatment.   . Mild memory disturbances not amounting to dementia   . HTN (hypertension)   . Diabetes type 2, controlled     if controlled not specified  . Obesity   . Hx of cholecystectomy   . Fibroid uterus     w/postmenopausal vaginal bleeding  . MENORRHAGIA, POSTMENOPAUSAL 02/06/2010    Qualifier: Diagnosis of  By: Diona Browner MD, Amy    . COPD (chronic obstructive pulmonary disease)   . Atrial flutter     a. new onset 08/2014; b. s/p unsuccessful DCCV 09/26/14 NSR briefly -->junctional rhythm into the 30s reuiring neo, bagging, & dopamine, HR improved with amio washout   . Chronic systolic CHF (congestive heart failure) 2/10    a. echo 01/2009: mod LVH, EF 01-09%, mild diastolic dysfunction, normal RV size/fxn, ? RA mass; b. echo 09/01/2014: EF 40-45%, severely dilated LA/RA, trival  global pericardial effusion, mod-severe MR, mod-severe TR,   . Kidney failure       Review of Systems  Constitutional: Negative for fever and unexpected weight change.  HENT: Negative for congestion, dental problem, ear pain, nosebleeds, postnasal drip, rhinorrhea, sinus pressure, sneezing, sore throat and trouble swallowing.   Eyes:  Negative for redness and itching.  Respiratory: Positive for shortness of breath. Negative for cough, chest tightness and wheezing.   Cardiovascular: Positive for leg swelling. Negative for palpitations.  Gastrointestinal: Negative for nausea and vomiting.  Genitourinary: Negative for dysuria.  Musculoskeletal: Negative for joint swelling.  Skin: Negative for rash.  Neurological: Negative for headaches.  Hematological: Does not bruise/bleed easily.  Psychiatric/Behavioral: Negative for dysphoric mood. The patient is not nervous/anxious.        Objective:   Physical Exam  Filed Vitals:   10/22/14 1115  BP: 108/74  Pulse: 80  Height: 5\' 3"  (1.6 m)  Weight: 170 lb 12.8 oz (77.474 kg)  SpO2: 100%   RA  Gen.: Elderly female, no distress HEENT: NCAT extraocular movements intact Pulmonary clear to auscultation bilaterally Cardiovascular irregularly irregular no murmurs gallops or rubs Abdomen Bowel sounds positive nontender Extremities trace edema in ankles Neuro alert and oriented moves all 4 extremities well  08/31/2014 chest x-ray reviewed: There are bilateral pleural effusions and bilateral atelectasis. There is cardiomegaly.       Assessment & Plan:   COPD (chronic obstructive pulmonary disease) She carries this diagnosis but she has no symptoms of shortness of breath, her lung exam is normal, and she has a normal oxygenation on room air. Therefore, I question whether or not she truly has COPD.  Plan: We will follow up the lung function test results  Atrial  flutter She was initially referred to me for evaluation of her risk for using amiodarone for a flutter. Fortunately, she is no longer using amiodarone. She does have a slightly abnormal chest x-ray which has not changed in over 6 years. I suspect the mild scarring on her chest x-ray is related to an old infection.  At this point I see no further indication for followup with the pulmonary clinic. We will call her with  the results of the lung function test if they are abnormal.    Updated Medication List Outpatient Encounter Prescriptions as of 10/22/2014  Medication Sig  . apixaban (ELIQUIS) 2.5 MG TABS tablet Take 1 tablet (2.5 mg total) by mouth 2 (two) times daily.  . carvedilol (COREG) 3.125 MG tablet Take 1 tablet (3.125 mg total) by mouth 2 (two) times daily.  Marland Kitchen donepezil (ARICEPT) 5 MG tablet Take 1 tablet (5 mg total) by mouth at bedtime.  . ferrous sulfate 325 (65 FE) MG tablet Take 1 tablet (325 mg total) by mouth daily with breakfast.  . furosemide (LASIX) 40 MG tablet Take 40 mg by mouth daily.  Marland Kitchen lisinopril (PRINIVIL,ZESTRIL) 10 MG tablet Take 1 tablet (10 mg total) by mouth daily.  . pravastatin (PRAVACHOL) 20 MG tablet Take 20 mg by mouth daily.

## 2014-10-22 NOTE — Assessment & Plan Note (Signed)
She carries this diagnosis but she has no symptoms of shortness of breath, her lung exam is normal, and she has a normal oxygenation on room air. Therefore, I question whether or not she truly has COPD.  Plan: We will follow up the lung function test results

## 2014-10-22 NOTE — Assessment & Plan Note (Signed)
Likely at baseline weight for pt. No clear dehydration on exam.

## 2014-10-22 NOTE — Progress Notes (Signed)
   Subjective:    Patient ID: Monica Hurst, female    DOB: 1924-12-02, 80 y.o.   MRN: [id number]  HPI    Review of Systems     Objective:   Physical Exam        Assessment & Plan:

## 2014-10-22 NOTE — Assessment & Plan Note (Signed)
Start trial of aricept. Follow up and repeat MMSE in 3 months.

## 2014-10-22 NOTE — Telephone Encounter (Signed)
Called and spoke with Monica Hurst at [place] to see if the home health nurse has been checking blood sugars on Monica Hurst when they go out and see her.  No records of CBG's being checked.  Verbal order given to have [facility] Nurse check fasting blood sugar three times a week for three weeks with the hope of teaching the family how to check Monica Hurst blood glucose once home health visit stop.  We are unsure if Monica Hurst has a glucose meter at home so her niece will come by and pick up one from our office ([place]).  Prescription for test strips and lancets sent to [facility].  [name] will call us with results after check Monica Hurst fasting CBG's.

## 2014-10-22 NOTE — Progress Notes (Addendum)
Subjective:    Patient ID: Monica Hurst, female    DOB: 11/16/1924, 80 y.o.   MRN: [id number]  HPI 80 year old female with dementia, CKD, HTN, CHF DM and recent dx of aflutter and cardiomyopathy presents for follow up.  She saw cardiology Dr Fletcher Anon on 11/4.  At that time she was doing well after cardioversion, maintaining sinus rhythm. She was started on small dose of carvedilol.  She is alone at [place] today, with nephew in waiting room, but he is the driving her. We will need to contact her niece Monica Hurst.  ADDENDUM:NEICE ARRIVED AT END OF APPT. sHE HAS NOT STARTED CARVEDILOLOL YET. haS BEEN ON HIGHER DOSE LAISX 40 MG DAILY IN LAST FEW DAYS PER ? CARDS.  She reports that she is doing well. Her swelling has improved a lot.  BP Readings from Last 3 Encounters:  10/22/14 143/87  10/09/14 148/96  10/04/14 140/90   DM, off metformin . At last check well controlled on 10/30. Lab Results  Component Value Date   HGBA1C 5.9 10/04/2014  At that time cr was also improving. HH checking but not sure numbers.  Her albumin was very low, recommended  increase protein in her diet ( consider low sugar meal supplement shake like Glucerna WITH meals)  She reports today she has not been doing.  Wt Readings from Last 3 Encounters:  10/22/14 170 lb 12 oz (77.452 kg)  10/09/14 198 lb (89.812 kg)  10/04/14 201 lb (91.173 kg)  Weight shows a drop not sure how accurate this is. She feels like fluid is at a good level.    Review of Systems  Constitutional: Negative for fever and fatigue.  HENT: Negative for ear pain.   Eyes: Negative for pain.  Respiratory: Negative for shortness of breath and wheezing.   Cardiovascular: Negative for chest pain, palpitations and leg swelling.  Gastrointestinal: Negative for abdominal pain.  Genitourinary: Negative for dysuria.       Objective:   Physical Exam  Constitutional: Vital signs are normal. She appears well-developed and well-nourished. She  is cooperative.  Non-toxic appearance. She does not appear ill. No distress.  HENT:  Head: Normocephalic.  Right Ear: Hearing, tympanic membrane, external ear and ear canal normal. Tympanic membrane is not erythematous, not retracted and not bulging.  Left Ear: Hearing, tympanic membrane, external ear and ear canal normal. Tympanic membrane is not erythematous, not retracted and not bulging.  Nose: No mucosal edema or rhinorrhea. Right sinus exhibits no maxillary sinus tenderness and no frontal sinus tenderness. Left sinus exhibits no maxillary sinus tenderness and no frontal sinus tenderness.  Mouth/Throat: Uvula is midline, oropharynx is clear and moist and mucous membranes are normal.  Eyes: Conjunctivae, EOM and lids are normal. Pupils are equal, round, and reactive to light. Lids are everted and swept, no foreign bodies found.  Neck: Trachea normal and normal range of motion. Neck supple. Carotid bruit is not present. No thyroid mass and no thyromegaly present.  Cardiovascular: Normal rate, regular rhythm, S1 normal, S2 normal, normal heart sounds, intact distal pulses and normal pulses.  Exam reveals no gallop and no friction rub.   No murmur heard. B 1 plus edema but less than at last OV.  Pulmonary/Chest: Effort normal and breath sounds normal. No tachypnea. No respiratory distress. She has no decreased breath sounds. She has no wheezes. She has no rhonchi. She has no rales.  Abdominal: Soft. Normal appearance and bowel sounds are normal. There is no tenderness.  Neurological: She is alert. She is not disoriented.  oriented to self not place and time  Skin: Skin is warm, dry and intact. No rash noted.  Psychiatric: Her speech is normal and behavior is normal. Judgment and thought content normal. Her mood appears not anxious. Cognition and memory are normal. She does not exhibit a depressed mood.          Assessment & Plan:

## 2014-10-22 NOTE — Telephone Encounter (Signed)
I called the patient's niece to let her know that the lung function tests did in fact show COPD.  I don't recommend inhaled therapy given her Afib issues and lack of respiratory complaints.

## 2014-10-22 NOTE — Assessment & Plan Note (Signed)
Stable control. 

## 2014-10-22 NOTE — Patient Instructions (Addendum)
We will get blood sugars from home health.  Stop at lab on way out. Try to add Glucerna shake to meals to help increase protein.  Start medication for memory, aricept daily. Follow up in 3 months 30 min with labs fasting prior.

## 2014-10-23 ENCOUNTER — Telehealth: Payer: Self-pay

## 2014-10-23 LAB — BASIC METABOLIC PANEL
BUN: 18 mg/dL (ref 6–23)
CALCIUM: 8.8 mg/dL (ref 8.4–10.5)
CO2: 34 mEq/L — ABNORMAL HIGH (ref 19–32)
Chloride: 104 mEq/L (ref 96–112)
Creatinine, Ser: 1.3 mg/dL — ABNORMAL HIGH (ref 0.4–1.2)
GFR: 49.27 mL/min — ABNORMAL LOW (ref >60.00)
GLUCOSE: 90 mg/dL (ref 70–99)
Potassium: 3.6 mEq/L (ref 3.5–5.1)
Sodium: 144 mEq/L (ref 135–145)

## 2014-10-23 NOTE — Telephone Encounter (Signed)
Yes, ,pt should no currently be on metformin.

## 2014-10-23 NOTE — Telephone Encounter (Signed)
Olivia Mackie RN with [name] left v/m;Tracy prepares pt medication weekly planner box; pts niece told Olivia Mackie pt is not taking metformin. Olivia Mackie will not put metformin in med box but Olivia Mackie request cb to confirm pt is not taking metformin.

## 2014-10-23 NOTE — Telephone Encounter (Signed)
Left message for Olivia Mackie with [name] that Metformin was stopped by Dr. Diona Browner.

## 2014-10-24 ENCOUNTER — Encounter: Payer: Self-pay | Admitting: *Deleted

## 2014-10-24 ENCOUNTER — Encounter: Payer: Medicare Other | Admitting: Physician Assistant

## 2014-10-24 ENCOUNTER — Encounter: Payer: Self-pay | Admitting: Physician Assistant

## 2014-10-24 NOTE — Progress Notes (Signed)
Patient was a no show.   This encounter was created in error - please disregard. 

## 2014-10-28 NOTE — Assessment & Plan Note (Signed)
Well controlled. Continue current medication.  

## 2014-10-28 NOTE — Assessment & Plan Note (Signed)
Due fo re-eval off atorvastatin

## 2014-10-28 NOTE — Assessment & Plan Note (Signed)
Due for re-eval. 

## 2014-10-28 NOTE — Assessment & Plan Note (Signed)
Follow blood sugars at home off metformin.

## 2014-10-28 NOTE — Assessment & Plan Note (Signed)
Take a dose of furosemide today to help with swelling. Use it as needed.

## 2014-10-30 ENCOUNTER — Encounter: Payer: Self-pay | Admitting: Pulmonary Disease

## 2014-11-05 DIAGNOSIS — I480 Paroxysmal atrial fibrillation: Secondary | ICD-10-CM

## 2014-11-05 DIAGNOSIS — I5042 Chronic combined systolic (congestive) and diastolic (congestive) heart failure: Secondary | ICD-10-CM

## 2014-11-05 DIAGNOSIS — I4892 Unspecified atrial flutter: Secondary | ICD-10-CM

## 2014-11-05 DIAGNOSIS — E119 Type 2 diabetes mellitus without complications: Secondary | ICD-10-CM

## 2014-11-05 DIAGNOSIS — M6281 Muscle weakness (generalized): Secondary | ICD-10-CM

## 2014-11-07 ENCOUNTER — Telehealth: Payer: Self-pay | Admitting: *Deleted

## 2014-11-07 NOTE — Telephone Encounter (Signed)
Any recommendations?

## 2014-11-07 NOTE — Telephone Encounter (Signed)
Please call Niece Monica Hurst. Patient is out of Eloquis and is very expensive. What else can they do?

## 2014-11-08 NOTE — Telephone Encounter (Signed)
The only other cheaper alternative is Warfarin.

## 2014-11-11 ENCOUNTER — Telehealth: Payer: Self-pay

## 2014-11-11 NOTE — Telephone Encounter (Signed)
Informed patient of Dr. Jacklynn Ganong response  Informed patient that I will leave her Eliquis samples and patient assistance card at the front desk  Patient verbalized understanding

## 2014-11-11 NOTE — Telephone Encounter (Signed)
[name] nurse with [name] left v/m requesting verbal order to continue nursing home health seeing pt 2 x a week for 3 weeks for medication mgt; pts niece advised [name] that pt cannot afford Eliquis and request alternative med be prescribed. [name] request cb.

## 2014-11-12 NOTE — Telephone Encounter (Signed)
Daughter needs to talk to her cardiologist about different med other than elliquis. Okay to given verbal orders for home health.

## 2014-11-12 NOTE — Telephone Encounter (Signed)
Verbal order given to [name] with Monica Hurst to continue nursing home health 2 x a week for 3 weeks for medication management.  Informed [name] to have Monica Hurst niece to contact her cardiologist about different medications other than Eliquis.  [name] wanted to let Dr. Diona Browner also know that she has been checking Monica Hurst's blood sugars when she is going out (which is the only time her BS is being checked) and they have all been good,  in the 90 range.

## 2014-11-12 NOTE — Telephone Encounter (Signed)
Wonderful. 

## 2014-11-12 NOTE — Telephone Encounter (Signed)
Left message for Monica Hurst with [name] to return my call.

## 2014-11-25 ENCOUNTER — Ambulatory Visit: Payer: Medicare Other | Admitting: Physician Assistant

## 2014-11-26 ENCOUNTER — Ambulatory Visit: Payer: Medicare Other | Admitting: Internal Medicine

## 2014-11-27 ENCOUNTER — Telehealth: Payer: Self-pay

## 2014-11-27 NOTE — Telephone Encounter (Signed)
Elmyra Ricks with [name] left v/m requesting verbal orders to recertify pt for services to monitor BS, CHF and hypertension.Please advise.

## 2014-11-28 ENCOUNTER — Other Ambulatory Visit: Payer: Self-pay | Admitting: *Deleted

## 2014-11-28 NOTE — Telephone Encounter (Signed)
Verbal orders given to [facility]

## 2014-11-28 NOTE — Telephone Encounter (Signed)
Okay to provide verbal orders as requested. 

## 2014-12-10 ENCOUNTER — Encounter: Payer: Self-pay | Admitting: Cardiovascular Disease

## 2014-12-10 ENCOUNTER — Ambulatory Visit (INDEPENDENT_AMBULATORY_CARE_PROVIDER_SITE_OTHER): Payer: Medicare Other | Admitting: Cardiovascular Disease

## 2014-12-10 VITALS — BP 168/82 | HR 54 | Ht 63.0 in | Wt 170.0 lb

## 2014-12-10 DIAGNOSIS — I4892 Unspecified atrial flutter: Secondary | ICD-10-CM

## 2014-12-10 DIAGNOSIS — I10 Essential (primary) hypertension: Secondary | ICD-10-CM

## 2014-12-10 DIAGNOSIS — I5022 Chronic systolic (congestive) heart failure: Secondary | ICD-10-CM

## 2014-12-10 DIAGNOSIS — I480 Paroxysmal atrial fibrillation: Secondary | ICD-10-CM

## 2014-12-10 MED ORDER — LISINOPRIL 20 MG PO TABS: 20.0000 mg | ORAL_TABLET | Freq: Every day | ORAL | Status: DC

## 2014-12-10 NOTE — Progress Notes (Signed)
HPI  81 y.o. female who is here today for a follow-up regarding atrial flutter and chronic systolic heart failure. She has known history of COPD, hypertension, type 2 diabetes,chronic kidney disease, chronic systolic heart failure and recent diagnosis of atrial flutter. She was started on metoprolol, amiodarone and Eliquis. She underwent routine cardioversion on October 22 and converted to sinus rhythm. However, she had significant bradycardia with junctional rhythm in the 30s with hypotension. She was started on dopamine. Both metoprolol and amiodarone were discontinued.  Echo showed EF 45%, severe biatrial enlargement with a left atrium of 54 mm, moderate MR and TR. It was felt her cardiomyopathy was 2/2 her arrhythmia. During last visit, I started small dose carvedilol. She has been doing reasonably well and denies any chest pain, shortness of breath or palpitations. No dizziness or syncope.  No Known Allergies   Current Outpatient Prescriptions on File Prior to Visit  Medication Sig Dispense Refill  . apixaban (ELIQUIS) 2.5 MG TABS tablet Take 1 tablet (2.5 mg total) by mouth 2 (two) times daily. 60 tablet 6  . BAYER MICROLET LANCETS lancets Use to check fasting blood sugar daily.  Dx: E11.8 100 each 3  . carvedilol (COREG) 3.125 MG tablet Take 1 tablet (3.125 mg total) by mouth 2 (two) times daily. 60 tablet 6  . donepezil (ARICEPT) 5 MG tablet Take 1 tablet (5 mg total) by mouth at bedtime. 30 tablet 11  . ferrous sulfate 325 (65 FE) MG tablet Take 1 tablet (325 mg total) by mouth daily with breakfast. 30 tablet 6  . furosemide (LASIX) 20 MG tablet     . glucose blood (BAYER CONTOUR NEXT TEST) test strip Use to check fasting blood sugar daily.  Dx: E11.8 100 each 3  . lisinopril (PRINIVIL,ZESTRIL) 10 MG tablet Take 1 tablet (10 mg total) by mouth daily. 30 tablet 6  . pravastatin (PRAVACHOL) 20 MG tablet Take 20 mg by mouth daily.      No current facility-administered medications on  file prior to visit.     Past Medical History  Diagnosis Date  . Breast cancer     right-sided. s/p lumpectomy in 7/09. that was followed by mammoSite adjuvant radiation treatment.   . Mild memory disturbances not amounting to dementia   . HTN (hypertension)   . Diabetes type 2, controlled     if controlled not specified  . Obesity   . Hx of cholecystectomy   . Fibroid uterus     w/postmenopausal vaginal bleeding  . MENORRHAGIA, POSTMENOPAUSAL 02/06/2010    Qualifier: Diagnosis of  By: Diona Browner MD, Amy    . COPD (chronic obstructive pulmonary disease)   . Atrial flutter     a. new onset 08/2014; b. s/p successful DCCV 09/26/14 NSR briefly -->junctional rhythm into the 30s reuiring neo, bagging, & dopamine, HR improved with amio washout   . Chronic systolic CHF (congestive heart failure) 2/10    a. echo 01/2009: mod LVH, EF 67-67%, mild diastolic dysfunction, normal RV size/fxn, ? RA mass; b. echo 09/01/2014: EF 40-45%, severely dilated LA/RA, trival  global pericardial effusion, mod-severe MR, mod-severe TR,   . Kidney failure      Past Surgical History  Procedure Laterality Date  . Abnormal myoview  12/09     lexiscan myoview showed EF 70%. no wall motion abnormalities. there was an inferior defect that was likely artifact. possibly some anterior ischemia  . Breast lumpectomy  7/09  . Vaginal hysterectomy  2010  for post menopausal bleeding     Family History  Problem Relation Age of Onset  . Coronary artery disease Neg Hx     no premature CAD  . Hypertension Mother   . Hypertension Father   . Cancer Sister     breast     History   Social History  . Marital Status: Widowed    Spouse Name: N/A    Number of Children: N/A  . Years of Education: N/A   Occupational History  . Not on file.   Social History Main Topics  . Smoking status: Never Smoker   . Smokeless tobacco: Never Used     Comment: does not smoke  . Alcohol Use: No  . Drug Use: No  . Sexual  Activity: Not on file   Other Topics Concern  . Not on file   Social History Narrative   Pt lives in [place] with her sister. She has no children but does have nieces and nephews.       PHYSICAL EXAM   BP 168/82 mmHg  Pulse 54  Ht 5\' 3"  (1.6 m)  Wt 170 lb (77.111 kg)  BMI 30.12 kg/m2 Constitutional: She is oriented to person, place, and time. She appears frail. No distress.  HENT: No nasal discharge.  Head: Normocephalic and atraumatic.  Eyes: Pupils are equal and round. No discharge.  Neck: Normal range of motion. Neck supple. No JVD present. No thyromegaly present.  Cardiovascular: Normal rate, regular rhythm, normal heart sounds. Exam reveals no gallop and no friction rub. No murmur heard.  Pulmonary/Chest: Effort normal and breath sounds normal. No stridor. No respiratory distress. She has no wheezes. She has no rales. She exhibits no tenderness.  Abdominal: Soft. Bowel sounds are normal. She exhibits no distension. There is no tenderness. There is no rebound and no guarding.  Musculoskeletal: Normal range of motion. She exhibits no edema and no tenderness.  Neurological: She is alert and oriented to person, place, and time. Coordination normal.  Skin: Skin is warm and dry. No rash noted. She is not diaphoretic. No erythema. No pallor.  Psychiatric: She has a normal mood and affect. Her behavior is normal. Judgment and thought content normal.     CVE:LFYBO  Bradycardia  -Prominent R(V1) and left axis -nonspecific  -Seen with pulmonary disease -possible anterior fascicular block.   -  Nonspecific T-abnormality.   ABNORMAL     ASSESSMENT AND PLAN

## 2014-12-10 NOTE — Assessment & Plan Note (Signed)
She appears to be euvolemic. Blood pressure is still elevated. Carvedilol cannot be increased due to baseline bradycardia. I increased the dose of lisinopril to 20 mg once daily.

## 2014-12-10 NOTE — Patient Instructions (Signed)
Increase Lisinopril to 20 mg once daily.   Continue other medications.   Follow up in 3 months.

## 2014-12-10 NOTE — Assessment & Plan Note (Signed)
Lisinopril was increased as outlined above.

## 2014-12-10 NOTE — Assessment & Plan Note (Signed)
She is currently maintaining in sinus rhythm and has not had any recurrent arrhythmia. Continue small dose carvedilol and long-term anticoagulation with Eliquis. Co-pay is high. I discussed alternative which is treatment with warfarin. She prefers to stay on Eliquis for now.

## 2014-12-11 ENCOUNTER — Ambulatory Visit: Payer: Medicare Other | Admitting: Internal Medicine

## 2014-12-26 ENCOUNTER — Telehealth: Payer: Self-pay | Admitting: Family Medicine

## 2014-12-26 ENCOUNTER — Telehealth: Payer: Self-pay | Admitting: Pulmonary Disease

## 2014-12-26 NOTE — Telephone Encounter (Signed)
Merry Proud, RN was filling pt's pill box and he said pt was missing some of her medications and would like a refill of Rxs. He said pt needs a refill of Amiodarone 200mg , and metoprolol 25 mg (1/2 tab BID), pt is also out of Eliquis and pt's niece would like a sample, Merry Proud said we can call him back at [phone] or we can call pt's niece [name] at [phone]

## 2014-12-26 NOTE — Telephone Encounter (Signed)
ATC NA WCB 

## 2014-12-26 NOTE — Telephone Encounter (Signed)
Spoke with Merry Proud and advised Metoprolol or Amiodarone is not the Ms. Lenhard current medication list.  Also advised Merry Proud to have pt's niece contact the cardiologist office for Eliquis samples.  Our office does not keep any samples.

## 2014-12-29 NOTE — Telephone Encounter (Signed)
Agreed -

## 2014-12-30 NOTE — Telephone Encounter (Signed)
Attempted to call. No answer. Will try back.

## 2014-12-31 MED ORDER — APIXABAN 2.5 MG PO TABS: 2.5000 mg | ORAL_TABLET | Freq: Two times a day (BID) | ORAL | Status: DC

## 2014-12-31 NOTE — Telephone Encounter (Signed)
Called, spoke with Monica Hurst.  She put me on hold and line was disconnected.  WCB.

## 2014-12-31 NOTE — Telephone Encounter (Signed)
Dorian Pod (nurse aid) returned call- [phone]

## 2014-12-31 NOTE — Telephone Encounter (Signed)
Spoke with Monica Hurst, pt needs eliquis samples.  These have been placed up front.  Nothing further needed.

## 2014-12-31 NOTE — Telephone Encounter (Signed)
lmtcb x1 

## 2015-01-10 ENCOUNTER — Telehealth: Payer: Self-pay

## 2015-01-10 NOTE — Telephone Encounter (Signed)
LVM 2/5

## 2015-01-10 NOTE — Telephone Encounter (Signed)
Pt states she needs a letter from Dr. Fletcher Anon stating she is ok to drive. She states her nephew is trying to take he car away, due to her being in the hospital, and needs Dr. Fletcher Anon to state it is ok for her to drive. Please call.

## 2015-01-14 ENCOUNTER — Telehealth: Payer: Self-pay | Admitting: Family Medicine

## 2015-01-14 DIAGNOSIS — E119 Type 2 diabetes mellitus without complications: Secondary | ICD-10-CM

## 2015-01-14 DIAGNOSIS — N183 Chronic kidney disease, stage 3 unspecified: Secondary | ICD-10-CM

## 2015-01-14 DIAGNOSIS — E785 Hyperlipidemia, unspecified: Secondary | ICD-10-CM

## 2015-01-14 DIAGNOSIS — D509 Iron deficiency anemia, unspecified: Secondary | ICD-10-CM

## 2015-01-14 DIAGNOSIS — E538 Deficiency of other specified B group vitamins: Secondary | ICD-10-CM

## 2015-01-14 NOTE — Telephone Encounter (Signed)
-----   Message from Ellamae Sia sent at 01/09/2015  5:18 PM EST ----- Regarding: Lab orders for Wednesday, 2.10.16 Lab orders for a 3 month f/u

## 2015-01-15 ENCOUNTER — Other Ambulatory Visit (INDEPENDENT_AMBULATORY_CARE_PROVIDER_SITE_OTHER): Payer: Medicare Other

## 2015-01-15 ENCOUNTER — Telehealth: Payer: Self-pay | Admitting: *Deleted

## 2015-01-15 DIAGNOSIS — D509 Iron deficiency anemia, unspecified: Secondary | ICD-10-CM

## 2015-01-15 DIAGNOSIS — N183 Chronic kidney disease, stage 3 unspecified: Secondary | ICD-10-CM

## 2015-01-15 DIAGNOSIS — E785 Hyperlipidemia, unspecified: Secondary | ICD-10-CM

## 2015-01-15 DIAGNOSIS — E119 Type 2 diabetes mellitus without complications: Secondary | ICD-10-CM

## 2015-01-15 DIAGNOSIS — E538 Deficiency of other specified B group vitamins: Secondary | ICD-10-CM

## 2015-01-15 LAB — COMPREHENSIVE METABOLIC PANEL
ALT: 11 U/L (ref 0–35)
AST: 17 U/L (ref 0–37)
Albumin: 3.3 g/dL — ABNORMAL LOW (ref 3.5–5.2)
Alkaline Phosphatase: 50 U/L (ref 39–117)
BILIRUBIN TOTAL: 0.4 mg/dL (ref 0.2–1.2)
BUN: 21 mg/dL (ref 6–23)
CHLORIDE: 105 meq/L (ref 96–112)
CO2: 35 mEq/L — ABNORMAL HIGH (ref 19–32)
CREATININE: 1.22 mg/dL — AB (ref 0.40–1.20)
Calcium: 9.2 mg/dL (ref 8.4–10.5)
GFR: 53.46 mL/min — AB (ref >60.00)
GLUCOSE: 98 mg/dL (ref 70–99)
Potassium: 3.9 mEq/L (ref 3.5–5.1)
Sodium: 144 mEq/L (ref 135–145)
Total Protein: 6.1 g/dL (ref 6.0–8.3)

## 2015-01-15 LAB — LIPID PANEL
CHOL/HDL RATIO: 3
Cholesterol: 160 mg/dL (ref 0–200)
HDL: 62.2 mg/dL (ref >39.00)
LDL CALC: 84 mg/dL (ref 0–99)
NonHDL: 97.8
Triglycerides: 68 mg/dL (ref 0.0–149.0)
VLDL: 13.6 mg/dL (ref 0.0–40.0)

## 2015-01-15 LAB — HEMOGLOBIN A1C: Hgb A1c MFr Bld: 6.4 % (ref 4.6–6.5)

## 2015-01-15 LAB — VITAMIN B12: Vitamin B-12: 148 pg/mL — ABNORMAL LOW (ref 211–911)

## 2015-01-15 LAB — VITAMIN D 25 HYDROXY (VIT D DEFICIENCY, FRACTURES): VITD: 75.37 ng/mL (ref 30.00–100.00)

## 2015-01-15 NOTE — Telephone Encounter (Signed)
Santiago Glad at [place] lab called to notify use that pt's cbc needed to be redrawn due to a blood clot having formed in the specific tube needed for that test. Lab cannot perform test due to clot.   I called and spoke with pt's niece. I informed her that pt needed to return to have more blood drawn. Pt's niece was unsure when she could bring pt in due to her work schedule. I informed her of our extended hours today and tomorrow, she will try to bring pt then.

## 2015-01-16 ENCOUNTER — Telehealth: Payer: Self-pay

## 2015-01-16 NOTE — Telephone Encounter (Signed)
Verbal order given to Jenny Reichmann with [name] for health social worker re-evaluation.

## 2015-01-16 NOTE — Telephone Encounter (Signed)
Okay for verbal order

## 2015-01-16 NOTE — Telephone Encounter (Signed)
Monica Hurst with [name] left v/m requesting verbal order for home health social worker to do re evaluation before discharge from [facility] for long range planning and facilitate higher level of participation of family; pt states family does not bring pt meals and if brings a meal no more than 1 a day. Needs to verify this info.Please advise.

## 2015-01-16 NOTE — Telephone Encounter (Signed)
Noted  

## 2015-01-22 ENCOUNTER — Ambulatory Visit: Payer: Medicare Other | Admitting: Family Medicine

## 2015-01-23 ENCOUNTER — Telehealth: Payer: Self-pay

## 2015-01-23 NOTE — Telephone Encounter (Signed)
Okay to given verbal orders as requested. 

## 2015-01-23 NOTE — Telephone Encounter (Signed)
Left message for Morene Antu, with [name], verbal orders for one additional social work visit per Dr. Diona Browner.

## 2015-01-23 NOTE — Telephone Encounter (Signed)
[place] social worker with [name] left v/m requesting verbal orders for one additional social work visit to make sure pt is connected with meals on wheels and discuss other options with possible assisted living.Please advise.

## 2015-01-24 NOTE — Telephone Encounter (Signed)
Patient seen by PCP

## 2015-01-30 ENCOUNTER — Telehealth: Payer: Self-pay

## 2015-01-30 NOTE — Telephone Encounter (Signed)
[name] nurse with [name] left /vm requesting cb to review pts meds. Left v/m requesting cb.

## 2015-02-05 NOTE — Telephone Encounter (Signed)
Left v/m for Monica Hurst to cb.

## 2015-02-07 NOTE — Telephone Encounter (Signed)
Let home heath nurse know..Eliquis needs to be twice daily.  Definitely.  At last cardiology appt in 12/2014.Marland Kitchen lisinopril increased to 20 mg daily given elevated BPs.  As far as the amiodarone and metoprolol.. It looks like at last OV with Dr. Fletcher Anon, he did not mention these, not sure he was aware she was taking them.. Just a low dose carvedilol.. I will forward this question to her cardiologist, Dr. Fletcher Anon, for his recommendations.   I would continue current dose of lasix as she was euvolemic at last OV. Cards may want to weight in on the lasix as well.  Butch Penny, will you also make sure she has follow up here in next month.

## 2015-02-07 NOTE — Telephone Encounter (Signed)
[name] left v/m returning call; pt is taking metoprolol 50 mg twice a day and amiodarone 200 mg daily; not on med list. Pt said has been taking lisinopril 10 mg one daily. On med list lisinopril 20 mg daily. And eliquis 2.5 mg taking 2 daily on med list but pt has been taking eliquis 2.5 mg once daily. Pt taking lasix 40 mg daily. Not on med list; pt was recently discharged from [facility]. [name] request what meds should pt being taking. The other meds on the med list are listed correctly and how pt is taking meds presently. [name] request cb next week.

## 2015-02-07 NOTE — Telephone Encounter (Signed)
Amiodarone and Metoprolol were both stopped previously due to significant bradycardia. She is not supposed to be on those. Continue same dose Lasix, Coreg and Lisinopril.

## 2015-02-07 NOTE — Telephone Encounter (Signed)
[name] notified as instructed by telephone.  Ms. Dascenzo has appointment scheduled for 02/11/2015 at 1:30 pm.. I ask [name] to remind Ms. Mullet niece of this appointment.

## 2015-02-10 NOTE — Telephone Encounter (Signed)
Monica Hurst with [name] notified by telephone of Dr. Tyrell Antonio instructions for Monica Hurst medications.

## 2015-02-11 ENCOUNTER — Ambulatory Visit: Payer: Medicare Other | Admitting: Family Medicine

## 2015-02-11 DIAGNOSIS — I4892 Unspecified atrial flutter: Secondary | ICD-10-CM | POA: Diagnosis not present

## 2015-02-11 DIAGNOSIS — I5042 Chronic combined systolic (congestive) and diastolic (congestive) heart failure: Secondary | ICD-10-CM | POA: Diagnosis not present

## 2015-02-11 DIAGNOSIS — I480 Paroxysmal atrial fibrillation: Secondary | ICD-10-CM

## 2015-02-11 DIAGNOSIS — E1140 Type 2 diabetes mellitus with diabetic neuropathy, unspecified: Secondary | ICD-10-CM | POA: Diagnosis not present

## 2015-02-11 DIAGNOSIS — F0390 Unspecified dementia without behavioral disturbance: Secondary | ICD-10-CM | POA: Diagnosis not present

## 2015-02-11 DIAGNOSIS — I10 Essential (primary) hypertension: Secondary | ICD-10-CM

## 2015-02-18 ENCOUNTER — Encounter: Payer: Self-pay | Admitting: Family Medicine

## 2015-02-18 ENCOUNTER — Ambulatory Visit (INDEPENDENT_AMBULATORY_CARE_PROVIDER_SITE_OTHER): Payer: Medicare Other | Admitting: Family Medicine

## 2015-02-18 VITALS — BP 169/71 | HR 47 | Temp 97.6°F | Ht 63.0 in | Wt 157.5 lb

## 2015-02-18 DIAGNOSIS — N183 Chronic kidney disease, stage 3 unspecified: Secondary | ICD-10-CM

## 2015-02-18 DIAGNOSIS — F0390 Unspecified dementia without behavioral disturbance: Secondary | ICD-10-CM

## 2015-02-18 DIAGNOSIS — D509 Iron deficiency anemia, unspecified: Secondary | ICD-10-CM

## 2015-02-18 DIAGNOSIS — E118 Type 2 diabetes mellitus with unspecified complications: Secondary | ICD-10-CM

## 2015-02-18 DIAGNOSIS — E43 Unspecified severe protein-calorie malnutrition: Secondary | ICD-10-CM

## 2015-02-18 DIAGNOSIS — E119 Type 2 diabetes mellitus without complications: Secondary | ICD-10-CM

## 2015-02-18 DIAGNOSIS — E785 Hyperlipidemia, unspecified: Secondary | ICD-10-CM | POA: Diagnosis not present

## 2015-02-18 DIAGNOSIS — F03B0 Unspecified dementia, moderate, without behavioral disturbance, psychotic disturbance, mood disturbance, and anxiety: Secondary | ICD-10-CM

## 2015-02-18 DIAGNOSIS — E538 Deficiency of other specified B group vitamins: Secondary | ICD-10-CM

## 2015-02-18 DIAGNOSIS — I5041 Acute combined systolic (congestive) and diastolic (congestive) heart failure: Secondary | ICD-10-CM

## 2015-02-18 DIAGNOSIS — I10 Essential (primary) hypertension: Secondary | ICD-10-CM

## 2015-02-18 MED ORDER — CYANOCOBALAMIN 1000 MCG/ML IJ SOLN
1000.0000 ug | Freq: Once | INTRAMUSCULAR | Status: AC
  Administered 2015-02-18: 1000 ug via INTRAMUSCULAR

## 2015-02-18 NOTE — Progress Notes (Signed)
Pre visit review using our clinic review tool, if applicable. No additional management support is needed unless otherwise documented below in the visit note. 

## 2015-02-18 NOTE — Progress Notes (Signed)
81 year old female with  Significant dementia, CKD, HTN, CHF DM and recent dx of aflutter and cardiomyopathy presents for follow up. She is at office ALONE, nephew drove her but unaware of care, out in car.  Dementia, moderate severe: on aricept. No one present for assessment of worsening, but she continues to have significant difficulty.  Eval: vit D nml, vit B12 low. MMSE today:  22/30  She saw cardiology Dr Fletcher Anon on 1/16 At that time she was doing well after cardioversion, maintaining sinus rhythm. She was started on small dose of carvedilol.  Hypertension:   On coreg, recent OV with Dr. Fletcher Anon increased lisinopril to 20 mg daily per note. Should not be taking amiodarone and metoprolol per Dr. Tyrell Antonio recent note.  pt is unable to repeat meds she is on.  BP Readings from Last 3 Encounters:  02/18/15 169/71  12/10/14 168/82  10/22/14 108/74  Using medication without problems or lightheadedness: None Chest pain with exertion:None Edema:None Short of breath:None Average home BPs:? Other issues:  CHF:  On lasix 20 mg daily   CKD: last cr  Stable at 1.22   DM, off metformin .well controlleld  Lab Results  Component Value Date   HGBA1C 6.4 01/15/2015  No ulcers, HH checking CBGs NO > 200, no < 60 per notes  She has lost significant weight since last OV! 43 lbs since 09/2015.  She states she is trying to eat healthy and lose weight she says with weight loss she feels better.  She states she eats 3 meals a day, no skipping.  Continue low albumin. Wt Readings from Last 3 Encounters:  02/18/15 157 lb 8 oz (71.442 kg)  12/10/14 170 lb (77.111 kg)  10/22/14 170 lb 12.8 oz (77.474 kg)       Review of Systems  Constitutional: Negative for fever and fatigue.  HENT: Negative for ear pain.  Eyes: Negative for pain.  Respiratory: Negative for shortness of breath and wheezing.  Cardiovascular: Negative for chest pain, palpitations and leg swelling.  Gastrointestinal: Negative  for abdominal pain.  Genitourinary: Negative for dysuria.       Objective:   Physical Exam  Constitutional: Vital signs are normal. She appears well-developed and well-nourished. She is cooperative. Non-toxic appearance. She does not appear ill. No distress.  HENT:  Head: Normocephalic.  Right Ear: Hearing, tympanic membrane, external ear and ear canal normal. Tympanic membrane is not erythematous, not retracted and not bulging.  Left Ear: Hearing, tympanic membrane, external ear and ear canal normal. Tympanic membrane is not erythematous, not retracted and not bulging.  Nose: No mucosal edema or rhinorrhea. Right sinus exhibits no maxillary sinus tenderness and no frontal sinus tenderness. Left sinus exhibits no maxillary sinus tenderness and no frontal sinus tenderness.  Mouth/Throat: Uvula is midline, oropharynx is clear and moist and mucous membranes are normal.  Eyes: Conjunctivae, EOM and lids are normal. Pupils are equal, round, and reactive to light. Lids are everted and swept, no foreign bodies found.  Neck: Trachea normal and normal range of motion. Neck supple. Carotid bruit is not present. No thyroid mass and no thyromegaly present.  Cardiovascular: Normal rate, regular rhythm, S1 normal, S2 normal, normal heart sounds, intact distal pulses and normal pulses. Exam reveals no gallop and no friction rub.  No murmur heard. B 1 plus edema but less than at last OV.  Pulmonary/Chest: Effort normal and breath sounds normal. No tachypnea. No respiratory distress. She has no decreased breath sounds. She has no  wheezes. She has no rhonchi. She has no rales.  Abdominal: Soft. Normal appearance and bowel sounds are normal. There is no tenderness.  Neurological: She is alert. She is not disoriented.  oriented to self not place and time  Skin: Skin is warm, dry and intact. No rash noted.  Psychiatric: Her speech is normal and behavior is normal. Judgment and thought content normal. Her  mood appears not anxious. Cognition and memory are normal. She does not exhibit a depressed mood.

## 2015-02-18 NOTE — Patient Instructions (Addendum)
Start b12 1000 mcg daily over the counter. Work on increasing protein in diet, could use glucerna shakes. Given b12 injection today. Follow up in 3 months with labs prior for DM.

## 2015-02-19 ENCOUNTER — Telehealth: Payer: Self-pay

## 2015-02-19 NOTE — Telephone Encounter (Signed)
Spoke with [name] at [facility].  She states she has not checked Monica Hurst blood sugars recently because they ran out of test strips and she informed her niece but the strips she needs for her meter are costly and the niece states they can't afford them.  [name] did say that when she was checking Monica Hurst's blood sugars, they were running good.  Mostly in the 90 range.   Blood Pressure Readings: 142/78 02/13/2015 128/68 02/07/2015 142/94 01/30/2015 142/78 01/16/2015  [name] states when she was first checking Monica Hurst's blood pressures they were running high but then she noticed she wasn't taking her medications.  Since then, [name] has been fixing a pill box for Monica Hurst and now her readings are much better.

## 2015-02-19 NOTE — Telephone Encounter (Signed)
Wonderful. Blood sugars are likely okay, so okay to not check them regularly.

## 2015-02-19 NOTE — Telephone Encounter (Signed)
[name] nurse with [name] left v/m returning call.

## 2015-02-20 NOTE — Telephone Encounter (Signed)
Left message for [name] that glucose does not need checked regularly.

## 2015-02-24 ENCOUNTER — Telehealth: Payer: Self-pay | Admitting: *Deleted

## 2015-02-24 NOTE — Telephone Encounter (Signed)
Pt niece calling for pt, pt would like samples of ELIQUIS. Pt only has about two pills left.  Please Call when ready.

## 2015-02-24 NOTE — Telephone Encounter (Signed)
Samples available of eliquis 5 mg told the niece to break the tablet in half.

## 2015-03-11 DIAGNOSIS — E43 Unspecified severe protein-calorie malnutrition: Secondary | ICD-10-CM | POA: Insufficient documentation

## 2015-03-11 NOTE — Assessment & Plan Note (Signed)
Moderate, pt taking aricept. Comes to clinic alone today, although nephew dropped her off. Concern for safety.  Asked pt if I could talk with niece ( she has not closer relatives other than sister who is also elderly) about her healthcare and changes today made. She refuses and state niece is taking advantage of her, She requests niece is removed from HIPPA disclosure list and  That we not talk with her about the pts's health. I asked pt to bring her sister or a "second set of ears" to appt with her... She will consider it.

## 2015-03-11 NOTE — Assessment & Plan Note (Signed)
Home health helping with medication, setting it out for her. I am concerned that she still amy not be taking meds as directed. BP poor control today.

## 2015-03-11 NOTE — Assessment & Plan Note (Signed)
Well controlled off metformin with recent weight loss in last year.

## 2015-03-11 NOTE — Assessment & Plan Note (Signed)
Euvolemic today on lasix daily.

## 2015-03-11 NOTE — Assessment & Plan Note (Addendum)
Pt with continued weight loss. Concenr that she is not getting adequate food at home. Pt gives no faily member that I can discuss this with. Start b12 1000 mcg daily over the counter. Push protein intake with glucerna shakes.  Close follow up.

## 2015-03-17 ENCOUNTER — Ambulatory Visit: Payer: Self-pay | Admitting: Cardiovascular Disease

## 2015-03-21 ENCOUNTER — Encounter: Payer: Self-pay | Admitting: Pulmonary Disease

## 2015-03-29 NOTE — Consult Note (Signed)
Admit Diagnosis:   NEW ONSET ARTRIAL FLUTTER: Onset Date: 01-Sep-2014, Status: Active, Description: NEW ONSET ARTRIAL FLUTTER      Admit Reason:   New onset atrial flutter (427.32): Status: Active, Coding System: ICD9, Coded Name: Atrial flutter   General Aspect 81 yo admitted with fall last week.  Weak with dyspnea and irregular pulse   Present Illness 81 yo feeling weak last weak.  Especially legs  Lives at home alone.  Does not use walker/cane  Still driving.  [address] last week  Did not hit head  Increasing dyspnea and fatiuge In ER noted to have CHF and rapid afib/flutter.  Improved with lasix and iv cardizem.  Reviewed Echo EF 45% with severe biatrial enlargement.  She inidicates never seeing a cardiologist before.  She has no chest pain.  Still feels legs are weak on getting up to bedside commode.  Does not notice afib No palpitations or syncope.  Despite "fall" last week she appears to be a candidate for anticoagulation.  Has not fallen before and no bleeding diathesis  Started on heparin by primary service.    No Known Allergies:   Case History and Physical Exam:  Past Medical Health Coronary Artery Disease, Hypertension, Diabetes Mellitus, Cancer, Stroke, Alcohol Consumption/Dependency, COPD   Family History Non-Contributory   HEENT PERLA   Neck/Nodes Supple   Chest/Lungs rales at base bilaterally   Cardiovascular No Murmurs or Gallops  Irregular Rate and Rhythm   Abdomen Benign   Neurological Grossly WNL   Skin Warm  Dry   Nursing/Ancillary Notes: **Vital Signs.:   27-Sep-15 07:00  Temperature Temperature (F) 97.6  Celsius 36.4  Temperature Source oral  Pulse Pulse 95  Pulse source if not from Vital Sign Device per cardiac monitor  Respirations Respirations 24  Systolic BP Systolic BP 701  Diastolic BP (mmHg) Diastolic BP (mmHg) 64  Mean BP 78  BP Source  if not from Vital Sign Device non-invasive  Pulse Ox % Pulse Ox % 96  Pulse Ox Activity Level  At rest   Oxygen Delivery 2L; Nasal Cannula  *Intake and Output.:   27-Sep-15 00:00  Grand Totals Intake:  26 Output:      Net:  26 24 Hr.:  109    00:35  Grand Totals Intake:   Output:  200    Net:  -200 24 Hr.:  -91    01:00  Grand Totals Intake:  21 Output:      Net:  21 24 Hr.:  -70    02:00  Grand Totals Intake:  21 Output:      Net:  21 24 Hr.:  -49    03:00  Grand Totals Intake:  16 Output:      Net:  16 24 Hr.:  -33    04:00  Grand Totals Intake:  16 Output:      Net:  16 24 Hr.:  -17    05:00  Grand Totals Intake:  16 Output:      Net:  16 24 Hr.:  -1    06:00  Grand Totals Intake:  16 Output:      Net:  16 24 Hr.:  15    06:21  Grand Totals Intake:   Output:      Net:   24 Hr.:  15  Current Weight (lbs) (lbs) 201.4    Shift 07:00  Grand Totals Intake:  132 Output:  200    Net:  -68 24 Hr.:  15  Daily 07:00  Grand Totals Intake:  215 Output:  200    Net:  15 24 Hr.:  15    07:01  Grand Totals Intake:  16 Output:      Net:  16 24 Hr.:  16    08:29  Grand Totals Intake:   Output:  300    Net:  -[place].:  -284    10:03  Grand Totals Intake:   Output:  200    Net:  -200 24 Hr.:  -484    Shift 15:00  Grand Totals Intake:  16 Output:  500    Net:  -952 84 Hr.:  -132   LabObservation:  27-Sep-15 06:37   OBSERVATION Reason for Test  Hepatic:  27-Sep-15 03:14   Bilirubin, Total  1.1  Alkaline Phosphatase 80 (46-116 NOTE: New Reference Range 06/25/14)  SGPT (ALT) 20 (14-63 NOTE: New Reference Range 06/25/14)  SGOT (AST) 20  Total Protein, Serum  5.6  Albumin, Serum  2.5  Cardiology:  27-Sep-15 06:37   Echo Doppler REASON FOR EXAM:     COMMENTS:     PROCEDURE: [place] - ECHO DOPPLER COMPLETE(TRANSTHOR)  - Sep 01 2014  6:37AM   RESULT: Echocardiogram Report  Patient Name:   Monica Hurst Date of Exam: 09/01/2014 Medical Rec #:  [id number]          Custom1: Date of Birth:  11/29/1924      Height:       64.0 in Patient Age:     31 years        Weight:       201.0 lb Patient Gender: F               BSA:          1.96 m??  Indications: Atrial Fib Sonographer:    [name] RDCS Referring Phys: Valentino Nose, K  Summary:  1. Left ventricular ejection fraction, by visual estimation, is 40 to  45%.  2. Severely dilated left atrium.  3. Severely dilated right atrium.  4. The pericardial effusion is globally pericardial effusion.  5. Trivial pericardial effusion, as described above.  6. Moderate to severe mitral valve regurgitation.  7. Moderate to severe tricuspid regurgitation.  8. Severely increased left ventricular posterior wall thickness.  9. Intact interatrial and interventricularsepta appear intact, with no  echocardiographic evidence of intracardiac shunting. 10. No cardiac source of embolism is identified. 2D AND M-MODE MEASUREMENTS (normal ranges within parentheses): Left Ventricle:          Normal IVSd (2D):      1.52 cm (0.7-1.1) LVPWd (2D):     1.52 cm (0.7-1.1) Aorta/LA:                  Normal LVIDd (2D):     5.06 cm (3.4-5.7) Aortic Root (2D): 2.90 cm (2.4-3.7) LVIDs (2D):     4.02 cm           Left Atrium (2D): 5.40 cm (1.9-4.0) LV FS (2D):     20.6 % (>25%) LV EF (2D):     41.7 %   (>50%)                                   Right Ventricle:  RVd (2D): LV SYSTOLIC FUNCTION BY 2D PLANIMETRY (MOD): EF-A4C View: 41.1 % SPECTRAL DOPPLER ANALYSIS (where applicable): Aortic Valve: AoV Max Vel: 1.40 m/s AoV Peak PG: 7.8 mmHg AoV Mean PG: LVOT Vmax: 0.87 m/s LVOT VTI:  LVOT Diameter: 2.10 cm AoV Area, Vmax: 2.15 cm?? AoV Area, VTI:  AoV Area, Vmn: Tricuspid Valve and PA/RV Systolic Pressure: TR Max Velocity: 2.50 m/s RA  Pressure: 10 mmHg RVSP/PASP: 35.1 mmHg Pulmonic Valve: PV Max Velocity: 0.92 m/s PV Max PG: 3.4 mmHg PV Mean PG: Pulmonic Insufficiency: PI EDV: 1.11 m/s PADP: 14.9 mmHg  PHYSICIAN INTERPRETATION: Left Ventricle: The left ventricular internal  cavity size was normal. LV  posterior wall thickness was severely increased. Left ventricular  ejection fraction, by visual estimation, is 40 to 45%. Right Ventricle: The right ventricular size is mildly enlarged. Left Atrium: The left atrium is severely dilated. Right Atrium: The right atrium is severely dilated. Pericardium: Trivial pericardial effusion is present. The pericardial  effusion is globally located around the entire heart. Mitral Valve: Moderate to severe mitral valve regurgitation is seen. Tricuspid Valve: Moderate to severe tricuspid regurgitation is  visualized. The tricuspid regurgitant velocity is 2.50 m/s, and with an  assumed right atrial pressure of 10 mmHg, the estimated right ventricular  systolicpressure is normal at 35.1 mmHg. Pulmonic Valve: Trace pulmonic valve regurgitation. Aorta: The aortic root is normal in size and structure. Shunts: Interatrial and interventricular septa appear intact, with no  evidence of intracardiac shunting by spectral or color Doppler.  [place] MD Electronically signed by [id number] Neoma Laming MD Signature Date/Time: 09/01/2014/11:10:11 AM *** Final ***  IMPRESSION: .    Verified By: Emmit Pomfret. Humphrey Rolls, M.D., MD  Routine Chem:  27-Sep-15 03:14   Cholesterol, Serum 102  Triglycerides, Serum 67  HDL (INHOUSE)  31  VLDL Cholesterol Calculated 13  LDL Cholesterol Calculated 58 (Result(s) reported on 01 Sep 2014 at 04:27AM.)  Glucose, Serum  144  BUN  30  Creatinine (comp)  1.79  Sodium, Serum 144  Potassium, Serum 4.2  Chloride, Serum  109  CO2, Serum 26  Calcium (Total), Serum  8.1  Osmolality (calc) 296  eGFR (African American)  35  eGFR (Non-African American)  28 (eGFR values <42m/min/1.73 m2 may be an indication of chronic kidney disease (CKD). Calculated eGFR, using the MRDR Study equation, is useful in  patients with stable renal function. The eGFR calculation will not be reliable in acutely ill  patients when serum creatinine is changing rapidly. It is not useful in patients on dialysis. The eGFR calculation may not be applicable to patients at the low and high extremes of body sizes, pregnant women, and vetetarians.)  Anion Gap 9    05:28   Iron Binding Capacity (TIBC) 284  Unbound Iron Binding Capacity 268  Iron, Serum  16  Iron Saturation 6 (Result(s) reported on 01 Sep 2014 at 09:11AM.)  Ferritin ([facility] 19 (Result(s) reported on 01 Sep 2014 at 09:23AM.)  Result Comment CBC - RESULTS VERIFIED BY REPEAT TESTING.  Result(s) reported on 01 Sep 2014 at 05:55AM.  Cardiac:  27-Sep-15 03:14   CPK-MB, Serum 0.9 (Result(s) reported on 01 Sep 2014 at 04:16AM.)  Troponin I 0.03 (0.00-0.05 0.05 ng/mL or less: NEGATIVE  Repeat testing in 3-6 hrs  if clinically indicated. >0.05 ng/mL: POTENTIAL  MYOCARDIAL INJURY. Repeat  testing in 3-6 hrs if  clinically indicated. NOTE: An increase or decrease  of 30% or more on serial  testing suggests a  clinically  important change)  Routine Sero:  27-Sep-15 10:08   Occult Blood, Feces NEGATIVE (Result(s) reported on 01 Sep 2014 at 10:35AM.)  Routine Hem:  27-Sep-15 05:28   WBC (CBC) 9.1  RBC (CBC) 4.07  Hemoglobin (CBC)  8.6  Hematocrit (CBC)  30.4  Platelet Count (CBC)  124  MCV  75  MCH  21.1  MCHC  28.2  RDW  18.8  Neutrophil % 74.3  Lymphocyte % 11.3  Monocyte % 8.9  Eosinophil % 4.6  Basophil % 0.9  Neutrophil #  6.8  Lymphocyte # 1.0  Monocyte # 0.8  Eosinophil # 0.4  Basophil # 0.1   Cardiology:    27-Sep-15 06:37, Echo Doppler  Echo Doppler   REASON FOR EXAM:      COMMENTS:       PROCEDURE: [place] - ECHO DOPPLER COMPLETE(TRANSTHOR)  - Sep 01 2014  6:37AM     RESULT: Echocardiogram Report    Patient Name:   Monica Hurst Date of Exam: 09/01/2014  Medical Rec #:  [id number]          Custom1:  Date of Birth:  11/16/1924      Height:       64.0 in  Patient Age:    30 years        Weight:       201.0 lb  Patient  Gender: F               BSA:          1.96 m??    Indications: Atrial Fib  Sonographer:    [name] RDCS  Referring Phys: Valentino Nose, K    Summary:   1. Left ventricular ejection fraction, by visual estimation, is 40 to   45%.   2. Severely dilated left atrium.   3. Severely dilated right atrium.   4. The pericardial effusion is globally pericardial effusion.   5. Trivial pericardial effusion, as described above.   6. Moderate to severe mitral valve regurgitation.   7. Moderate to severe tricuspid regurgitation.   8. Severely increased left ventricular posterior wall thickness.   9. Intact interatrial and interventricularsepta appear intact, with no   echocardiographic evidence of intracardiac shunting.  10. No cardiac source of embolism is identified.  2D AND M-MODE MEASUREMENTS (normal ranges within parentheses):  Left Ventricle:          Normal  IVSd (2D):      1.52 cm (0.7-1.1)  LVPWd (2D):     1.52 cm (0.7-1.1) Aorta/LA:                  Normal  LVIDd (2D):     5.06 cm (3.4-5.7) Aortic Root (2D): 2.90 cm (2.4-3.7)  LVIDs (2D):     4.02 cm           Left Atrium (2D): 5.40 cm (1.9-4.0)  LV FS (2D):     20.6 % (>25%)  LV EF (2D):     41.7 %   (>50%)                                    Right Ventricle:                                    RVd (2D):  LV SYSTOLIC FUNCTION BY 2D PLANIMETRY (  MOD):  EF-A4C View: 41.1 %  SPECTRAL DOPPLER ANALYSIS (where applicable):  Aortic Valve: AoV Max Vel: 1.40 m/s AoV Peak PG: 7.8 mmHg AoV Mean PG:  LVOT Vmax: 0.87 m/s LVOT VTI:  LVOT Diameter: 2.10 cm  AoV Area, Vmax: 2.15 cm?? AoV Area, VTI:  AoV Area, Vmn:  Tricuspid Valve and PA/RV Systolic Pressure: TR Max Velocity: 2.50 m/s RA   Pressure: 10 mmHg RVSP/PASP: 35.1 mmHg  Pulmonic Valve:  PV Max Velocity: 0.92 m/s PV Max PG: 3.4 mmHg PV Mean PG:  Pulmonic Insufficiency:  PI EDV: 1.11 m/s PADP: 14.9 mmHg    PHYSICIAN INTERPRETATION:  Left Ventricle: The left ventricular internal  cavity size was normal. LV   posterior wall thickness was severely increased. Left ventricular   ejection fraction, by visual estimation, is 40 to 45%.  Right Ventricle: The right ventricular size is mildly enlarged.  Left Atrium: The left atrium is severely dilated.  Right Atrium: The right atrium is severely dilated.  Pericardium: Trivial pericardial effusion is present. The pericardial   effusion is globally located around the entire heart.  Mitral Valve: Moderate to severe mitral valve regurgitation is seen.  Tricuspid Valve: Moderate to severe tricuspid regurgitation is   visualized. The tricuspid regurgitant velocity is 2.50 m/s, and with an   assumed right atrial pressure of 10 mmHg, the estimated right ventricular   systolicpressure is normal at 35.1 mmHg.  Pulmonic Valve: Trace pulmonic valve regurgitation.  Aorta: The aortic root is normal in size and structure.  Shunts: Interatrial and interventricular septa appear intact, with no   evidence of intracardiac shunting by spectral or color Doppler.    [place] MD  Electronically signed by [id number] Neoma Laming MD  Signature Date/Time: 09/01/2014/11:10:11 AM  *** Final ***    IMPRESSION: .        Verified By: Emmit Pomfret. Humphrey Rolls, M.D., MD    Impression 81 yo with new onset CHF and rapid atrial fib / flutter Severe biatrial enlargement and age suggest conversion not likely.  Rate control and anticoagulation seem appropriate.  CHF likely from rapid rate.  EF not that bad 40-45%  No evidence of acute ischemic event with no acute ECG changes, negative troponin and no chest pain.   Plan Increase cardizem to 60 q 6 and d/c drip after 2nd dose Continue lasix Continue heparin.  Dr Revonda Standard to see in am  would consider anticoagulation with low dose xarelto 32m given age and marginal CrCl   Electronic Signatures: NJosue Hector(MD)  (Signed 27-Sep-15 11:38)  Authored: Health Issues, General Aspect/Present Illness,  Allergies, History and Physical Exam, Vital Signs, Labs, Radiology, Impression/Plan   Last Updated: 27-Sep-15 11:38 by NJosue Hector(MD)

## 2015-03-29 NOTE — Discharge Summary (Signed)
PATIENT NAME:  KIEU, QUIGGLE MR#:  [id number] DATE OF BIRTH:  12-Apr-1924  DATE OF ADMISSION:  08/31/2014 DATE OF DISCHARGE:  09/05/2014  ADDENDUM  I am actually adding amiodarone 200 mg daily, as per cardiology consultation today, for better heart rate control and atrial flutter management.    ____________________________ Donell Beers. Benjie Karvonen, MD spm:JT D: 09/05/2014 10:11:24 ET T: 09/05/2014 10:17:37 ET JOB#: [id number]  cc: Marvalene Barrett P. Benjie Karvonen, MD, <Dictator> Donell Beers Adali Pennings MD ELECTRONICALLY SIGNED 09/05/2014 12:29

## 2015-03-29 NOTE — Discharge Summary (Signed)
PATIENT NAME:  Monica Hurst, Monica Hurst MR#:  [id number] DATE OF BIRTH:  [birth year]  DATE OF ADMISSION:  08/31/2014 DATE OF DISCHARGE:  09/05/2014  ADMISSION DIAGNOSES: 1.  New onset atrial flutter and rapid ventricular response.  2.  Acute congestive heart failure.  3.  Acute kidney injury.   DISCHARGE DIAGNOSES: 1.  Acute combined systolic and diastolic heart failure with ejection fraction of 40% to 45%.  2.  Atrial flutter.  3.  Acute renal failure from acute tubular necrosis and hypotension.  4.  Diabetes. 5.  Anemia, iron deficiency.   CONSULTATIONS:  1.  [facility].  2.  Dr. Murlean Iba, nephrology.   DIAGNOSTIC DATA: Laboratories at discharge: Sodium 142, potassium 4, chloride 106, bicarb 31, BUN 39, creatinine 2.9, glucose 85, calcium 8.3.  A 2-D echocardiogram showed an EF of 40% to 45% with moderate to severe TR and MR, increased left ventricular posterior wall thickness.   HISTORY OF PRESENT ILLNESS: An 81 year old female who was admitted on 08/31/2014 with the chief complaint of not feeling well, found to have new onset atrial flutter as well as acute congestive heart failure. For further details, please refer to Dr. Samantha Crimes H and P.  1.  Acute diastolic and systolic heart failure from atrial flutter and valve disease. The patient has severe TR and MR on echocardiogram. She was diuresed with Lasix, maybe over diuresed slightly as her creatinine did increase from her creatinine of 1.7 on admission. She had an echocardiogram that showed an EF of 40% to 45%, likely mildly depressed from her arrhythmia. She is currently euvolemic, not requiring oxygen and not complaining of shortness of breath. No crackles on lung exam.  2.  Atrial flutter, likely cause of the patient's CHF. The patient was initially on Cardizem which actually dropped her blood pressure. She remains on very low dose metoprolol and heart rates are better controlled. Her CHADS score is a 6 and so she will need  Eliquis and is being consented to use Eliquis. Appreciate [facility]'s input. If the patient becomes symptomatic, they recommend DC cardioversion or even outpatient DC cardioversion with 3 weeks of anticoagulation first.  3.  Acute renal failure from ATN and hypotension and atrial flutter and CHF. She has some small improvement in her creatinine. Okay to discharge the patient today with close follow-up with a BMP, holding any nephrotoxic agents, including ACE inhibitor and Lasix.  4.  Diabetes. Holding metformin due to problem #3. The patient will be on sliding scale insulin for now.  5.  Anemia, microcytic anemia, likely related to iron deficiency. She is on iron supplements. Will need to monitor hemoglobin also as the patient is on Eliquis.  DISCHARGE MEDICATIONS: 1.  Eliquis 2.5 b.i.d.  2.  Metoprolol 12.5 q. 12. 3.  Ferrous fumarate 324 daily. 4.  Humalog sliding scale.   NOTE: We are holding metformin and Lasix for now. No NSAIDs or nephrotoxic agents.   DISCHARGE DIET: Low sodium.  DISCHARGE ACTIVITY: As tolerated.   DISCHARGE FOLLOWUP: The patient will need follow-up in 1 week with Dr. Fletcher Anon and Dr. Candiss Norse. Please check BMP on 09/06/2014. Send results to [facility]. Check blood pressure before giving medications. Hold metoprolol if systolic blood pressure less than 120. Avoid hypotension.   The patient is a stable for discharge. The patient is FULL code status.  TIME SPENT ON DISCHARGE: Approximately 37 minutes. ____________________________ Donell Beers. Benjie Karvonen, MD spm:sb D: 09/05/2014 09:24:17 ET T: 09/05/2014 09:47:01 ET JOB#: [id number]  cc: Ulice Bold  Eustace Pen, MD, <Dictator> Murlean Iba, MD Mertie Clause Fletcher Anon, MD Donell Beers Taccara Bushnell MD ELECTRONICALLY SIGNED 09/05/2014 12:28

## 2015-03-29 NOTE — H&P (Signed)
PATIENT NAME:  Monica Hurst, MEATH MR#:  [id number] DATE OF BIRTH:  04/16/1924  DATE OF ADMISSION:  09/26/2014  Primary Care Physician:  Nonlocal.   CHIEF COMPLAINT: Hypotension and bradycardia post cardioversion.   HISTORY OF PRESENT ILLNESS: This is an 81 year old female, who presented to the hospital today for elective electrical cardioversion. Post cardioversion, the patient developed some junctional rhythm, became bradycardic, and also became acutely hypotensive with blood pressures in the 60s to 70s. The patient was given some IV fluids and also started on low-dose dopamine. After getting those interventions, the patient's hemodynamics have improved. Hospitalist services were contacted for further treatment and evaluation. The patient had some nausea and felt dizzy during the episode of hypotension and bradycardia, but feels much better now. She denied any chest pain, any shortness of breath, any fevers, chills, cough, belly pain, diarrhea, or any other associated symptoms presently.   REVIEW OF SYSTEMS:  CONSTITUTIONAL: No documented fever. No weight gain, no weight loss.  EYES: No blurred or double vision.  ENT: No tinnitus. No postnasal drip. No redness of the oropharynx.  RESPIRATORY: No cough. No wheeze. No hemoptysis, no dyspnea.  CARDIOVASCULAR: No chest pain, no orthopnea, no palpitation, no syncope.  GASTROINTESTINAL: No nausea, vomiting, diarrhea, no abdominal pain. No melena or hematochezia.  GENITOURINARY: No dysuria or hematuria.  ENDOCRINE: No polyuria or nocturia. No heat or cold intolerance.  HEMATOLOGIC: No anemia. No bruising. No bleeding.  INTEGUMENTARY: No rashes. No lesions.  MUSCULOSKELETAL: No arthritis, no swelling, no gout.  NEUROLOGIC: No numbness, tingling or ataxia. No seizure activity.  PSYCHIATRIC: No anxiety. No insomnia. No ADD.   PAST MEDICAL HISTORY: Consistent with chronic atrial fibrillation/flutter, history of chronic kidney disease stage III, diabetes,  chronic anemia, history of CHF.   ALLERGIES: No known drug allergies.   SOCIAL HISTORY: No smoking, no alcohol abuse. No illicit drug abuse. Lives with her niece.   FAMILY HISTORY: Diabetes runs in her mother's side of the family. Father died from old age.   CURRENT MEDICATIONS: Are as follows: Amiodarone 200 mg daily, apixaban 2.5 mg b.i.d., Ferrous sulfate 324 mg daily, Humalog sliding scale, Lasix 40 mg daily, lisinopril 10 mg daily, metformin 500 mg daily, metoprolol tartrate 12.5 mg b.i.d., and Pravachol 20 mg daily.   PHYSICAL EXAMINATION: Presently is as follows:  VITAL SIGNS: Temperature is 98.2, pulse 108, respirations 17, blood pressure 131/85, saturations 99% on room air.  GENERAL: She is a pleasant -appearing female in no apparent distress.  HEENT: Atraumatic, normocephalic. Extraocular muscles are intact. Pupils equal and reactive to light. Sclerae anicteric. No conjunctival injection. No pharyngeal erythema.  NECK: Supple. There is no jugular venous distention. No bruits. No lymphadenopathy. No thyromegaly.  HEART: Regular rate and rhythm. No murmurs, no rubs, no clicks.  LUNGS: Clear to auscultation bilaterally. No rales, no rhonchi, no wheezes.  ABDOMEN: Soft, flat, nontender, nondistended. Has good bowel sounds. No hepatosplenomegaly appreciated.  EXTREMITIES: No evidence of any cyanosis, clubbing, or peripheral edema. Has +2 pedal and radial pulses bilaterally.  NEUROLOGIC: The patient is alert, awake and oriented x 3 with no focal motor or sensory deficits appreciated bilaterally.  SKIN: Moist and warm with no rashes appreciated.  LYMPHATIC: There is no cervical or axillary lymphadenopathy.   LABORATORY DATA: Serum glucose of 105, BUN 26, creatinine 1.8, sodium 144, potassium 3.5, chloride 102, bicarbonate 36. LFTs are within normal limits. Troponin 0.02. White cell count 6.3, hemoglobin 9.5, hematocrit 34.4, platelet count 167,000.   ASSESSMENT AND PLAN:  This is an  81 year old female with history of chronic atrial fibrillation, chronic kidney disease stage III, hypertension, hyperlipidemia, chronic anemia, history of congestive heart failure, who presents to the hospital due to elective electrical cardioversion and postprocedure noted to be in a junctional rhythm with hypotension.  1. Sick sinus syndrome. The patient apparently went into a junctional rhythm and became bradycardic shortly after cardioversion. As per the cardiologist, this is likely related to the amiodarone that she was on prior to coming in; therefore, she needs a washout of that, at this point. Currently she is on low-dose dopamine and is hemodynamically stable, and we will continue to follow. We will have cardiology assess her off dopamine to see if she possibly may need a pacemaker down the road. 2. Shock. This is likely cardiogenic shock, as the patient became bradycardic and hypotensive, and in a junctional rhythm. This has clinically improved with dopamine as the patient is presently hemodynamically stable. We will continue follow to hemodynamics.  3. Chronic kidney disease, stage III. The patient's creatinine is close to baseline. We will continue to monitor. The patient follows with Dr. Candiss Norse as an outpatient.  4. Diabetes. Place on sliding scale insulin. Hold metformin for now given her chronic kidney disease. She likely should likely not be on metformin upon discharge.  5. Chronic anemia. Continue iron supplements.  6. History of chronic atrial fibrillation, status post cardioversion, currently in normal sinus rhythm. Hold beta-blocker due to sick sinus syndrome and shock. I will continue the Eliquis. Cardiology will continue to follow.   CODE STATUS: The patient is a Full Code.   CRITICAL CARE TIME SPENT: Is 50 minutes.    ____________________________ Belia Heman. Verdell Carmine, MD vjs:[id number] D: 09/26/2014 12:05:29 ET T: 09/26/2014 13:35:01 ET JOB#: [id number]  cc: Belia Heman. Verdell Carmine, MD,  <Dictator> Henreitta Leber MD ELECTRONICALLY SIGNED 10/14/2014 11:05

## 2015-03-29 NOTE — H&P (Signed)
PATIENT NAME:  Monica Hurst, Monica Hurst MR#:  [id number] DATE OF BIRTH:  05/12/1924  DATE OF ADMISSION:  08/31/2014  REFERRING PHYSICIAN:  Conni Slipper, MD.  PRIMARY CARE PHYSICIAN:  Nonlocal.   CHIEF COMPLAINT: "Not feeling well."  An 81 year old African American female with past medical history of hypertension, presenting with "not feeling well."  She is unable to further qualify exactly what she meant by that statement, however, she does describe having bilateral lower extremity edema which has been progressive for about 1-2 weeks duration. However, denies any dyspnea on exertion, chest pain, shortness of breath, orthopnea, or PND and presented to the hospital with generalized malaise as stated above, that was persistent throughout the day and on arrival she was noted be in atrial flutter with 2:1 heart block with heart rates around the 130 range. Thus she was started on Cardizem drip.   REVIEW OF SYSTEMS: CONSTITUTIONAL: Denies fever. Positive for fatigue, weakness.  EYES:  No blurred vision, double vision or eye pain.  ENT:  Denies hearing loss,  RESPIRATORY:  Denies cough or shortness of breath.  CARDIOVASCULAR: Denies chest pain, palpitations.  Positive for edema as described above.  GASTROINTESTINAL: Denies nausea, vomiting, diarrhea or abdominal pain.  GENITOURINARY: Denies dysuria or hematuria.  ENDOCRINE:  Denies nocturia or thyroid problems.  HEMATOLOGIC:  Denies easy bruising, bleeding.  SKIN:  No rashes, lesion. MUSCULOSKELETAL: Denies pain in the neck, back, shoulders, knees, hips or arthritic symptoms.  NEUROLOGIC: Denies paralysis, paresthesias.  PSYCHIATRIC: Denies anxiety or depressive symptoms.   Otherwise full review of systems performed by me is negative.  PAST MEDICAL HISTORY: Hypertension.   SOCIAL HISTORY: Denies any alcohol, tobacco, or drug use. Fully independent for activities of daily living.   FAMILY HISTORY: Denies any knowledge of cardiovascular or pulmonary  disorders in any family members.   ALLERGIES: No known drug allergies.   HOME MEDICATIONS: She unfortunately does not know her home medications at this time.   PHYSICAL EXAMINATION:  VITAL SIGNS: Temperature 97.4, heart rate 131, respirations 22, blood pressure 120/78, saturating 97% on room air. Weight 86.2 kg, BMI of 32.6.  GENERAL: Well-nourished, well-developed, African American female, currently in no acute distress.  HEAD: Normocephalic, atraumatic.  EYES: Pupils equal, reactive to light. Extraocular muscles intact. No scleral icterus. MOUTH: Dry mucosal membranes. Dentition intact. No abscess noted.  EARS, NOSE, AND THROAT: Clear without exudates. No external lesions. NECK: Supple. No thyromegaly or nodules. No JVD. PULMONARY: Diminished breath sounds with coarse rhonchi at the bases. Good air entry bilaterally. Good respiratory effort. CHEST: Nontender to palpation.  CARDIOVASCULAR: S1, S2. Irregular rhythm. No murmurs, rubs, or gallops. There is 2+ pitting edema to the knees bilaterally. Pedal pulses 2+ bilaterally.  GASTROINTESTINAL: Soft, nontender, nondistended. No masses. Positive bowel sounds. No hepatosplenomegaly.  MUSCULOSKELETAL: No swelling, clubbing, or edema. Range of motion full in all extremities.  NEUROLOGIC: Cranial nerves II-XII intact. No gross focal neurologic deficits. Sensation intact. Reflexes intact.  SKIN: No ulceration, lesion, rash or cyanosis. Skin warm and dry, turgor intact. PSYCHIATRIC: Mood and affect within normal limits. Alert and oriented x 3. Insight and judgment intact.   EKG performed revealing atrial flutter with 2:1 heart block, heart rate in the 130s; however, no ST or T wave abnormalities.   Chest x-ray performed revealing cardiomegaly as well as bilateral pleural effusions.   LABORATORY DATA INCLUDES: Sodium of 145, potassium 4.1, chloride 109, bicarbonate 29, BUN 30, creatinine 1.85, glucose 156, total protein 6.3, albumin 2.8,  alkaline  phosphatase  95, AST 22, ALT 27. Troponin 0.04. TSH 2.63, WBCs 8.5, hemoglobin 9.9, platelets of 88,000.   ASSESSMENT AND PLAN: An 81 year old African American female presenting with generalized malaise, found to be in atrial flutter, 2:1 block.  1.  New onset atrial flutter. Will admit to the step down unit with goal heart rate being less than 120. Her CHADS score will be considered 2 given age and hypertension. Will initiate a heparin drip, check a transthoracic echocardiogram, TSH. Trend cardiac enzymes x 3. Consult cardiology.  2.  Acute congestive heart failure with unknown ejection fraction, likely in relation to rapid atrial flutter. Diurese with Lasix. Follow intake and output, urine output, renal function as well as daily weights. Echocardiogram as stated above.  3.  Acute kidney injury, likely prerenal in relation to decreased cardiac output. Once again continue with careful diuresis as mentioned above and follow renal function.  4.  Hypertension, on no medications, however, blood pressure currently controlled at this time. If required can start p.r.n. hydralazine post echocardiogram.  We will have a slightly better idea once we obtain her ejection fraction whether p.o. agents such as ACE inhibitors and beta blockade.  5.  DVT prevention, provide a heparin drip.   The patient is a full code.   TIME SPENT: 45 minutes critical care.    ____________________________ Aaron Mose. Hower, MD dkh:lt D: 08/31/2014 20:43:39 ET T: 08/31/2014 22:35:24 ET JOB#: [id number]  cc: Aaron Mose. Hower, MD, <Dictator> DAVID Woodfin Ganja MD ELECTRONICALLY SIGNED 09/01/2014 23:39

## 2015-03-29 NOTE — Consult Note (Signed)
General Aspect PCP: Eliezer Lofts, MD Primary Cardiologist: Dr. Rockey Situ, MD ___________________  81 year old female with history of COPD, HTN, DM2, and chronic systolic CHF and a-fib/atrial flutter as of 08/2014 who presented to [facility] this morning for cardioversion. She did convert to NSR for a brief moment; however this was followed  junctional rhythm with HR into the 30s requiring neo and respiratory assistance from anesthesia that was at the bedside, severe hypotension with SBP in the 60s. She then developed  tachycardia into the 110 range bpm c/w sick sinus syndrome. She was placed on dopamine 4 to 5 ug/min. Heart rate variabl;e between 30-40 bpm junction rhythm up to 100 bpm. Hospital service was contacted to help with managing and admit the patient for observation and management of her arrhythmia and amiodarone "washo out".  _________________  PMH: 1. Chronic systolic CHF 2. A-fib/flutter 3. COPD 4. HTN 5. DM2 __________________   Present Illness Prior to her recent hospital admission in September 2015 she was without any known cardiac history. However on 9/26 she presented to [facility] with generalized weakness and palpitations. She was found to be atrial flutter with intermittent conduction. We were unable to exactly determine when she went into this rhythm and we were unable to schedule TEE/cardioversion while she was inpatient. She was placed on Lopressor 25 mg bid, loaded with amiodarone to a goal of 200 mg daily (currently on), and started on Eliquis 2.5 mg bid (renal dosing). Echo showed EF 45%, severe biatrial enlargement with a left atrium of 54 mm, moderate MR and TR. It was felt her cardiomyopathy was 2/2 her arrhythmia.  She did follow up with pulmonology who felt she was safe to continue amiodarone from a pulmonary standpoint - her follow up labs were improved from inpatient at that time.    She was discharged to [facility] and did quite well there and has unitltimately been  discharged from their care. Her weight has remained stable. At her office recheck on 10/20 she remained in atrial flutter with variable conduction vs coarse a-fib and decided to proceed with cardioversion. She made an informed decison and was aware of the risks.   She did convert to NSR for a brief moment; however this was followed  junctional rhythm with HR into the 30s requiring neo and respiratory assistance from anesthesia that was at the bedside, severe hypotension with SBP in the 60s. She then developed  tachycardia into the 110 range bpm c/w sick sinus syndrome. She was placed on dopamine 4 to 5 ug/min. Heart rate variabl;e between 30-40 bpm junction rhythm up to 100 bpm.   Physical Exam:  GEN well developed, well nourished, obese   HEENT hearing intact to voice, moist oral mucosa   NECK supple   RESP normal resp effort  clear BS   CARD Irregular rate and rhythm  Murmur   Murmur Systolic  3/6 mitral regurg   ABD denies tenderness  soft  normal BS   EXTR negative edema   SKIN normal to palpation   NEURO motor/sensory function intact   PSYCH alert, A+O to time, place, person   Review of Systems:  Subjective/Chief Complaint Tired   General: Weakness   Skin: No Complaints   ENT: No Complaints   Eyes: No Complaints   Neck: No Complaints   Respiratory: No Complaints   Cardiovascular: No Complaints   Gastrointestinal: No Complaints   Genitourinary: No Complaints   Vascular: No Complaints   Musculoskeletal: No Complaints  Neurologic: No Complaints   Hematologic: No Complaints   Endocrine: No Complaints   Psychiatric: No Complaints   Review of Systems: All other systems were reviewed and found to be negative   Medications/Allergies Reviewed Medications/Allergies reviewed   Family & Social History:  Family and Social History:  Family History Negative  mother: HTN; father: HTN; sister: breast CA   Social History negative tobacco, negative ETOH,  negative Illicit drugs   Place of Living Home     Systolic CHF:    CAD:    Atrial flutter:    COPD:    Diabetes Mellitus,Type I (IDD):    mild memory disturbance:    breast cancer:    hypertension:    Diabetes - Borderline:    Hypertension:    Hysterectomy - Partial:    Breast  lumpectomy:    Cholecystectomy:          Admit Diagnosis:   BRADYCARDIA SHOCK: Onset Date: 26-Sep-2014, Status: Active, Description: BRADYCARDIA SHOCK  Home Medications: Medication Instructions Status  amiodarone 200 mg oral tablet 1 tab(s) orally once a day Active  HumaLOG 100 units/mL subcutaneous solution Sliding Scale before meals and at bedtime:0-149=0,150-200=2 units, 201-250=4 units, 251-300=6 units, 301-350=8 units,351-400=10 units Active  apixaban 2.5 mg oral tablet 1 tab(s) orally 2 times a day Active  metoprolol 12.5 milligram(s) orally every 12 hours Active  ferrous fumarate 324 mg (106 mg elemental iron) oral tablet 1 tab(s) orally  Active  Lasix    '40mg'$  once daily Active  lisinopril 10 milligram(s)  once a day Active  metFORMIN extended release 500 mg oral tablet, extended release 1 tab(s) orally once a day Active  pravastatin 20 mg oral tablet 1 tab(s) orally once a day (at bedtime) Active   Lab Results:  Hepatic:  22-Oct-15 09:13   Bilirubin, Total 0.9  Alkaline Phosphatase 100 (46-116 NOTE: New Reference Range 06/25/14)  SGPT (ALT) 29 (14-63 NOTE: New Reference Range 06/25/14)  SGOT (AST) 35  Total Protein, Serum 6.7  Albumin, Serum  2.7  Routine Chem:  22-Oct-15 09:13   Glucose, Serum  105  BUN  26  Creatinine (comp)  1.84  Sodium, Serum 144  Potassium, Serum 3.5  Chloride, Serum 102  CO2, Serum  36  Calcium (Total), Serum 8.7  Osmolality (calc) 292  eGFR (African American)  33  eGFR (Non-African American)  28 (eGFR values <32mL/min/1.73 m2 may be an indication of chronic kidney disease (CKD). Calculated eGFR, using the MRDR Study equation, is useful  in  patients with stable renal function. The eGFR calculation will not be reliable in acutely ill patients when serum creatinine is changing rapidly. It is not useful in patients on dialysis. The eGFR calculation may not be applicable to patients at the low and high extremes of body sizes, pregnant women, and vetetarians.)  Anion Gap  6  Cardiac:  22-Oct-15 09:13   CK, Total 30 (26-192 NOTE: NEW REFERENCE RANGE  01/07/2014)  CPK-MB, Serum 1.2 (Result(s) reported on 26 Sep 2014 at 09:46AM.)  Troponin I 0.02 (0.00-0.05 0.05 ng/mL or less: NEGATIVE  Repeat testing in 3-6 hrs  if clinically indicated. >0.05 ng/mL: POTENTIAL  MYOCARDIAL INJURY. Repeat  testing in 3-6 hrs if  clinically indicated. NOTE: An increase or decrease  of 30% or more on serial  testing suggests a  clinically important change)  Routine Hem:  22-Oct-15 09:13   WBC (CBC) 6.3  RBC (CBC) 4.33  Hemoglobin (CBC)  9.5  Hematocrit (CBC)  34.4  Platelet Count (CBC) 167  MCV  79  MCH  22.0  MCHC  27.8  RDW  25.7  Neutrophil % 69.0  Lymphocyte % 11.1  Monocyte % 7.5  Eosinophil % 11.4  Basophil % 1.0  Neutrophil # 4.4  Lymphocyte #  0.7  Monocyte # 0.5  Eosinophil # 0.7  Basophil # 0.1 (Result(s) reported on 26 Sep 2014 at 09:34AM.)   EKG:  Interpretation EKG on arrival with atrial fib/flutter with rate in teh 60s EKG after cardioversion showing junctional escape rhythm with rate in teh 30s    No Known Allergies:   No Known Allergies:   Vital Signs/Nurse's Notes: **Vital Signs.:   22-Oct-15 08:56  Pulse Pulse 103  Respirations Respirations 23  Systolic BP Systolic BP 106  Diastolic BP (mmHg) Diastolic BP (mmHg) 82  Pulse Ox % Pulse Ox % 100    Impression 81 year old female with history of COPD, HTN, DM2, and chronic systolic CHF and a-fib/atrial flutter as of 08/2014 who presented to [facility] this morning for cardioversion. She did convert to NSR for a brief moment; however this was followed by the  development of a junctional rhythm with HR into the 30s requiring neo and bagging with occasional episodes of tachycardia into the 1-teens c/w sick sinus syndrome. She was placed on dopamine and remains stable.  1. A-fib/flutter: -Status post successful cardioversion 09/26/2014  underlying sick sinus syndrome, now with junctional rhythm, alternating with narrow complex tachycardia (possible accelerated junction or atrial tachycardia) -Discontinue amiodarone and Hold beta blocker for now. Will need amiodarone "wash out". -Continue Dopamine gtt for BP support -Continue Eliquis 2.5 (renal dosing) -If sick sinus syndrome/junctional rhythm  persists after a few days of amiodarone wash out she will need EP consult and PPM   2. Chronic systolic CHF: -Likely 2/2 the above atrial flutter -She does not appear to be volume overloaded on exam   -Monitor - may need low dose Lasix  3. History of renal insufficiency: -SCr improved since in the hospital   -Hold lisinopril   4. History of iron deficiency anemia: -Hgb stable  5. DM2: -Hold metformin -SSI   Electronic Signatures: Christell Faith M (PA-C)  (Signed 22-Oct-15 09:56)  Authored: General Aspect/Present Illness, History and Physical Exam, Review of System, Family & Social History, Past Medical History, Home Medications, Allergies, Vital Signs/Nurse's Notes, Impression/Plan Ida Rogue (MD)  (Signed 22-Oct-15 10:20)  Authored: General Aspect/Present Illness, History and Physical Exam, Review of System, Family & Social History, Health Issues, Labs, EKG , Impression/Plan  Co-Signer: General Aspect/Present Illness, History and Physical Exam, Review of System, Family & Social History, Past Medical History, Home Medications, Allergies, Vital Signs/Nurse's Notes, Impression/Plan   Last Updated: 22-Oct-15 10:20 by Ida Rogue (MD)

## 2015-03-29 NOTE — Discharge Summary (Signed)
PATIENT NAME:  Monica Hurst, Monica Hurst MR#:  [id number] DATE OF BIRTH:  1924/05/09  DATE OF ADMISSION:  09/26/2014 DATE OF DISCHARGE:  09/29/2014  ADMITTING DIAGNOSES:  1.  Bradycardia, post electrical cardioversion, possibly due to sick sinus syndrome, possibly due to amiodarone retention. The patient's heart rate now stable. Will need close outpatient monitoring, may need a pacemaker in the near future.  2.  Cardiogenic shock requiring a low dose dopamine due to bradycardia. Now blood pressure stable.  3.  Chronic kidney disease stage III.  4.  Diabetes.  5.  Chronic anemia.  6.  History of atrial fibrillation, status post cardioversion,    7.  History of congestive heart failure.   CONSULTANTS:  Ida Rogue, MD  PERTINENT LABS AND EVALUATIONS: Admitting glucose 105, BUN 26, creatinine 1.84, sodium 144, potassium 3.5, chloride 102, CO2 was 36, calcium level is 8.7. LFTs were normal except albumin at 2.7. CPK 30. Troponin 0.02. WBC 6.3, hemoglobin 9.5, platelet count is 167,000.  EKG showed junctional bradycardia.   HOSPITAL COURSE: Please refer to H and P done by the admitting physician. The patient is an 81 year old African American female who underwent an elective electrical cardioversion for atrial fibrillation, who was noted postprocedure to become very bradycardic and acutely hypotensive.   The patient received IV fluids and was started on low-dose dopamine. We were asked to admit the patient. She was thought to have cardiogenic shock related to possible sick sinus syndrome. The patient was kept on dopamine and IV fluids.  She also was previously treated for amiodarone for atrial fibrillation and it was felt by cardiology that the amiodarone clearance may have also caused the bradycardia.  The patient was kept off amiodarone and beta blocker with these interventions.  Her heart rate improved.  She was followed throughout the hospitalization by cardiology.  On the day of discharge her heart rate was  in the 60s. They felt that she was stable for discharge with close outpatient monitoring.   DISCHARGE MEDICATIONS: Lasix 40 daily, Apixaban 2.5 mg 1 tab p.o. b.i.d., iron sulfate 324 at 1 tabs p.o. daily, lisinopril 10 mg daily, metformin 500 mg 1 tab p.o. daily, pravastatin 20 at bedtime.   DISCHARGE DIET: Low-sodium, low-fat, low-cholesterol.   DISCHARGE INSTRUCTIONS:  Home health services physical therapy. Activity as tolerated.   FOLLOWUP:  With Dr. Rockey Situ in the coming weeks in the next few days     TIME SPENT: 35 minutes.     ____________________________ Lafonda Mosses Posey Pronto, MD shp:DT D: 10/01/2014 08:48:56 ET T: 10/01/2014 15:53:39 ET JOB#: [id number]  cc: Moriah Shawley H. Posey Pronto, MD, <Dictator> Alric Seton MD ELECTRONICALLY SIGNED 10/12/2014 8:36

## 2015-03-31 ENCOUNTER — Ambulatory Visit: Payer: Self-pay | Admitting: Cardiovascular Disease

## 2015-04-04 ENCOUNTER — Other Ambulatory Visit: Payer: Self-pay

## 2015-04-04 NOTE — Patient Outreach (Signed)
[facility]) Care Management  04/04/2015  [place] Apr 22, 1924 [id number]   RN CM attempted outreach call to this patient.  Patient is not eligible for [facility] services.  RN CM was calling to consult with patient to discuss other resources for her needs. Patient has chronic diseases of Heart failure, Diabetes and hypertension.  Patient was recently seen by [facility] and is now discharged from their services. Unable to reach patient at home number [phone].  RN CM will try again at a later date.    Maury Dus, RN, Ishmael Holter, [place] Telephonic Care Coordinator [phone]

## 2015-04-04 NOTE — Patient Outreach (Signed)
[facility]) Care Management  04/04/2015  Monica Hurst Jul 25, 1924 [id number]   [name] returned call.  RN CM informed her that a referral was made from [facility].  Explained that the referral stated [name] would not be seeing patient anymore.  Patient stated last week was the last time the nurse came out to see her.  RN CM explained the program of [facility] to her. RN CM explained that at this time she was not eligible for [facility] services, however we wanted to make sure she had access to any of the resources in her community.  Patient stated [employer] has been delivering for two weeks now.  Stated that has been a benefit to her.  States she is still able to do other meal she needs.  RN CM asked what was the nurse from [place] working with her on.  Patient stated the nurse filled her pill box weekly.  Patient states she does this now for herself and is very comfortable in doing so.  Patient states she knows her medications.  States she is very active in her church and the church has been a great help to her.  Patient states her family checks on her weekly and she feels safe in her home.  RN CM asked patient if she could think of any health care concerns and/ or resources she may need.  Patient stated she did not have any needs at this time.  RN CM asked patient did she have times that she forgot things.  Patient has a history of memory loss.  Patient stated being her age she may forget things from time to time, however most of the time her memory is very clear.   RN CM left contact number with patient and if she should think of a need for community resources to call.  RN CM will close this case.

## 2015-04-11 NOTE — Telephone Encounter (Signed)
eliquis samples never picked up and placed back.

## 2015-04-28 ENCOUNTER — Ambulatory Visit (INDEPENDENT_AMBULATORY_CARE_PROVIDER_SITE_OTHER): Payer: Medicare Other | Admitting: Cardiovascular Disease

## 2015-04-28 ENCOUNTER — Encounter: Payer: Self-pay | Admitting: Cardiovascular Disease

## 2015-04-28 VITALS — BP 182/70 | HR 47 | Ht 63.0 in | Wt 157.8 lb

## 2015-04-28 DIAGNOSIS — I480 Paroxysmal atrial fibrillation: Secondary | ICD-10-CM

## 2015-04-28 DIAGNOSIS — I10 Essential (primary) hypertension: Secondary | ICD-10-CM

## 2015-04-28 DIAGNOSIS — I4892 Unspecified atrial flutter: Secondary | ICD-10-CM

## 2015-04-28 DIAGNOSIS — I5022 Chronic systolic (congestive) heart failure: Secondary | ICD-10-CM

## 2015-04-28 MED ORDER — LISINOPRIL 40 MG PO TABS: 40.0000 mg | ORAL_TABLET | Freq: Every day | ORAL | Status: DC

## 2015-04-28 MED ORDER — APIXABAN 2.5 MG PO TABS: 2.5000 mg | ORAL_TABLET | Freq: Two times a day (BID) | ORAL | Status: DC

## 2015-04-28 NOTE — Patient Instructions (Signed)
Medication Instructions:  Your physician has recommended you make the following change in your medication:  INCREASE Lisinopril to 40mg  daily START taking Eliquis twice a day as prescribed   Labwork: None  Testing/Procedures: None  Follow-Up: Your physician wants you to follow-up in: [place] with Dr. Fletcher Anon. You will receive a reminder letter in the mail two months in advance. If you don't receive a letter, please call our office to schedule the follow-up appointment.   Any Other Special Instructions Will Be Listed Below (If Applicable).

## 2015-04-28 NOTE — Assessment & Plan Note (Signed)
Blood pressure continues to be elevated. It's not entirely clear if she is taking her medications regularly but she claims that she does. I increased the dose of lisinopril to 40 mg once daily.

## 2015-04-28 NOTE — Assessment & Plan Note (Signed)
She appears to be euvolemic. 

## 2015-04-28 NOTE — Assessment & Plan Note (Signed)
She continues to be in normal sinus rhythm with no recurrent arrhythmia. She is currently on low dose Eliquis given her age and chronic kidney disease. However, renal function has improved recently and if creatinine continues to be consistently below 1.5, I will consider increasing the dose to 5 mg twice daily. Continue small dose carvedilol.

## 2015-04-28 NOTE — Progress Notes (Signed)
HPI  81 y.o. female who is here today for a follow-up regarding atrial flutter and chronic systolic heart failure. She has known history of COPD, hypertension, type 2 diabetes,chronic kidney disease, chronic systolic heart failure and  atrial flutter. She underwent routine cardioversion on October 22 and converted to sinus rhythm. However, she had significant bradycardia with junctional rhythm in the 30s with hypotension. She was started on dopamine. Both metoprolol and amiodarone were discontinued. Echo showed EF 45%, severe biatrial enlargement with a left atrium of 54 mm, moderate MR and TR. It was felt her cardiomyopathy was 2/2 her arrhythmia. She has been tolerating small dose carvedilol. She continues to be bradycardic but she denies any dizziness. She does have dementia according to the notes although she seems to be oriented today. She actually still drives. She has no family members with her today. She doesn't know most of her medications. She does report that she has been taking Eliquis once daily and not twice daily. She denies any chest pain, shortness of breath or palpitations.  No Known Allergies   Current Outpatient Prescriptions on File Prior to Visit  Medication Sig Dispense Refill  . BAYER MICROLET LANCETS lancets Use to check fasting blood sugar daily.  Dx: E11.8 100 each 3  . carvedilol (COREG) 3.125 MG tablet Take 1 tablet (3.125 mg total) by mouth 2 (two) times daily. 60 tablet 6  . donepezil (ARICEPT) 5 MG tablet Take 1 tablet (5 mg total) by mouth at bedtime. 30 tablet 11  . ferrous sulfate 325 (65 FE) MG tablet Take 1 tablet (325 mg total) by mouth daily with breakfast. 30 tablet 6  . furosemide (LASIX) 20 MG tablet Take 20 mg by mouth daily.     Marland Kitchen glucose blood (BAYER CONTOUR NEXT TEST) test strip Use to check fasting blood sugar daily.  Dx: E11.8 100 each 3  . pravastatin (PRAVACHOL) 20 MG tablet Take 20 mg by mouth daily.      No current facility-administered  medications on file prior to visit.     Past Medical History  Diagnosis Date  . Breast cancer     right-sided. s/p lumpectomy in 7/09. that was followed by mammoSite adjuvant radiation treatment.   . Mild memory disturbances not amounting to dementia   . HTN (hypertension)   . Diabetes type 2, controlled     if controlled not specified  . Obesity   . Hx of cholecystectomy   . Fibroid uterus     w/postmenopausal vaginal bleeding  . MENORRHAGIA, POSTMENOPAUSAL 02/06/2010    Qualifier: Diagnosis of  By: Diona Browner MD, Amy    . COPD (chronic obstructive pulmonary disease)   . Atrial flutter     a. new onset 08/2014; b. s/p successful DCCV 09/26/14 NSR briefly -->junctional rhythm into the 30s reuiring neo, bagging, & dopamine, HR improved with amio washout   . Chronic systolic CHF (congestive heart failure) 2/10    a. echo 01/2009: mod LVH, EF 78-46%, mild diastolic dysfunction, normal RV size/fxn, ? RA mass; b. echo 09/01/2014: EF 40-45%, severely dilated LA/RA, trival  global pericardial effusion, mod-severe MR, mod-severe TR,   . Kidney failure      Past Surgical History  Procedure Laterality Date  . Abnormal myoview  12/09     lexiscan myoview showed EF 70%. no wall motion abnormalities. there was an inferior defect that was likely artifact. possibly some anterior ischemia  . Breast lumpectomy  7/09  . Vaginal hysterectomy  2010  for post menopausal bleeding     Family History  Problem Relation Age of Onset  . Coronary artery disease Neg Hx     no premature CAD  . Hypertension Mother   . Hypertension Father   . Cancer Sister     breast     History   Social History  . Marital Status: Widowed    Spouse Name: N/A  . Number of Children: N/A  . Years of Education: N/A   Occupational History  . Not on file.   Social History Main Topics  . Smoking status: Never Smoker   . Smokeless tobacco: Never Used     Comment: does not smoke  . Alcohol Use: No  . Drug Use: No    . Sexual Activity: Not on file   Other Topics Concern  . Not on file   Social History Narrative   Pt lives in [place] with her sister. She has no children but does have nieces and nephews.       PHYSICAL EXAM   BP 182/70 mmHg  Pulse 47  Ht 5\' 3"  (1.6 m)  Wt 71.555 kg (157 lb 12 oz)  BMI 27.95 kg/m2 Constitutional: She is oriented to person, place, and time. She appears frail. No distress.  HENT: No nasal discharge.  Head: Normocephalic and atraumatic.  Eyes: Pupils are equal and round. No discharge.  Neck: Normal range of motion. Neck supple. No JVD present. No thyromegaly present.  Cardiovascular: Normal rate, regular rhythm, normal heart sounds. Exam reveals no gallop and no friction rub. No murmur heard.  Pulmonary/Chest: Effort normal and breath sounds normal. No stridor. No respiratory distress. She has no wheezes. She has no rales. She exhibits no tenderness.  Abdominal: Soft. Bowel sounds are normal. She exhibits no distension. There is no tenderness. There is no rebound and no guarding.  Musculoskeletal: Normal range of motion. She exhibits no edema and no tenderness.  Neurological: She is alert and oriented to person, place, and time. Coordination normal.  Skin: Skin is warm and dry. No rash noted. She is not diaphoretic. No erythema. No pallor.  Psychiatric: She has a normal mood and affect. Her behavior is normal. Judgment and thought content normal.     CRF:VOHKGO sinus  Bradycardia  -Incomplete right bundle branch block.   -Old anteroseptal infarct.   ABNORMAL     ASSESSMENT AND PLAN

## 2015-05-13 ENCOUNTER — Telehealth: Payer: Self-pay | Admitting: Family Medicine

## 2015-05-13 DIAGNOSIS — E119 Type 2 diabetes mellitus without complications: Secondary | ICD-10-CM

## 2015-05-13 DIAGNOSIS — N183 Chronic kidney disease, stage 3 unspecified: Secondary | ICD-10-CM

## 2015-05-13 NOTE — Telephone Encounter (Signed)
-----   Message from Ellamae Sia sent at 05/07/2015  5:52 PM EDT ----- Regarding: Lab orders for Thursday, 6.9.16 Lab orders for a 3 month f/u

## 2015-05-15 ENCOUNTER — Other Ambulatory Visit: Payer: Medicare Other

## 2015-05-20 ENCOUNTER — Telehealth: Payer: Self-pay | Admitting: Family Medicine

## 2015-05-20 ENCOUNTER — Ambulatory Visit: Payer: Medicare Other | Admitting: Family Medicine

## 2015-05-20 NOTE — Telephone Encounter (Signed)
Pt needs follow up rescheduled in next few weks at least.

## 2015-05-20 NOTE — Telephone Encounter (Signed)
Patient did not come in for their appointment today for 3 mo follow up.  Please let me know if patient needs to be contacted immediately for follow up or no follow up needed.

## 2015-05-21 NOTE — Telephone Encounter (Signed)
rs for 6/16

## 2015-05-22 ENCOUNTER — Encounter: Payer: Self-pay | Admitting: Family Medicine

## 2015-05-22 ENCOUNTER — Ambulatory Visit (INDEPENDENT_AMBULATORY_CARE_PROVIDER_SITE_OTHER): Payer: Medicare Other | Admitting: Family Medicine

## 2015-05-22 VITALS — BP 158/82 | HR 57 | Temp 98.4°F | Ht 63.0 in | Wt 166.5 lb

## 2015-05-22 DIAGNOSIS — E119 Type 2 diabetes mellitus without complications: Secondary | ICD-10-CM | POA: Diagnosis not present

## 2015-05-22 DIAGNOSIS — D638 Anemia in other chronic diseases classified elsewhere: Secondary | ICD-10-CM | POA: Diagnosis not present

## 2015-05-22 DIAGNOSIS — I10 Essential (primary) hypertension: Secondary | ICD-10-CM | POA: Diagnosis not present

## 2015-05-22 DIAGNOSIS — I5041 Acute combined systolic (congestive) and diastolic (congestive) heart failure: Secondary | ICD-10-CM

## 2015-05-22 DIAGNOSIS — N183 Chronic kidney disease, stage 3 unspecified: Secondary | ICD-10-CM

## 2015-05-22 DIAGNOSIS — D509 Iron deficiency anemia, unspecified: Secondary | ICD-10-CM

## 2015-05-22 DIAGNOSIS — E538 Deficiency of other specified B group vitamins: Secondary | ICD-10-CM

## 2015-05-22 LAB — HM DIABETES FOOT EXAM

## 2015-05-22 NOTE — Assessment & Plan Note (Signed)
Due for re-eval. 

## 2015-05-22 NOTE — Progress Notes (Signed)
Subjective:    Patient ID: Monica Hurst, female    DOB: July 31, 1924, 81 y.o.   MRN: [id number]  HPI   81 year old female with complicated medical history including significant dementia, CKD, HTN, CHF, DM and recent dx of aflutter and cardiomyopathy presents alone for her  3 month follow up appt today.  She saw cardiology Dr Fletcher Anon on 04/28/2015 At that time she was doing well after cardioversion, maintaining sinus rhythm. Euvolemic. She was started on small dose of carvedilol.  On low dose eliquis.. If renal functions remains better he may consider increasing her dose.  BP was inadequately controlled.. Increased lisinopril to 40 mg daily.    Hypertension:  Pt is unable to repeat meds she is on. She states she increased lisinopril as old.. No SE. BP Readings from Last 3 Encounters:  05/22/15 158/82  04/28/15 182/70  02/18/15 169/71   Using medication without problems or lightheadedness:  none Chest pain with exertion: None Edema:NONe.Marland Kitchen Appears euvolemic today. Short of breath: None Average home BPs:Not checking regularly. Other issues:   Wt Readings from Last 3 Encounters:  05/22/15 166 lb 8 oz (75.524 kg)  04/28/15 157 lb 12 oz (71.555 kg)  02/18/15 157 lb 8 oz (71.442 kg)     Diabetes:  Due for re-eval today. Using medications without difficulties: Hypoglycemic episodes:? Hyperglycemic episodes:? Feet problems: None Blood Sugars averaging: Not checking. eye exam within last year: due.  CKD, due for re-eval.    Review of Systems  Constitutional: Negative for fever and fatigue.  HENT: Negative for ear pain.   Eyes: Negative for pain.  Respiratory: Negative for chest tightness and shortness of breath.   Cardiovascular: Negative for chest pain, palpitations and leg swelling.  Gastrointestinal: Negative for abdominal pain.  Genitourinary: Negative for dysuria.       Objective:   Physical Exam  Constitutional: Vital signs are normal. She appears  well-developed and well-nourished. She is cooperative.  Non-toxic appearance. She does not appear ill. No distress.  Elderly female in NAD  HENT:  Head: Normocephalic.  Right Ear: Hearing, tympanic membrane, external ear and ear canal normal. Tympanic membrane is not erythematous, not retracted and not bulging.  Left Ear: Hearing, tympanic membrane, external ear and ear canal normal. Tympanic membrane is not erythematous, not retracted and not bulging.  Nose: No mucosal edema or rhinorrhea. Right sinus exhibits no maxillary sinus tenderness and no frontal sinus tenderness. Left sinus exhibits no maxillary sinus tenderness and no frontal sinus tenderness.  Mouth/Throat: Uvula is midline, oropharynx is clear and moist and mucous membranes are normal.  Eyes: Conjunctivae, EOM and lids are normal. Pupils are equal, round, and reactive to light. Lids are everted and swept, no foreign bodies found.  Neck: Trachea normal and normal range of motion. Neck supple. Carotid bruit is not present. No thyroid mass and no thyromegaly present.  Cardiovascular: Regular rhythm, S1 normal, S2 normal, normal heart sounds, intact distal pulses and normal pulses.  Exam reveals no gallop and no friction rub.   No murmur heard. No current peripheral edema  Pulmonary/Chest: Effort normal and breath sounds normal. No tachypnea. No respiratory distress. She has no decreased breath sounds. She has no wheezes. She has no rhonchi. She has no rales.  Abdominal: Soft. Normal appearance and bowel sounds are normal. There is no tenderness.  Neurological: She is alert. She is not disoriented.  oriented to self not place and time  Skin: Skin is warm, dry and intact. No rash  noted.  Psychiatric: Her speech is normal and behavior is normal. Judgment and thought content normal. Her mood appears not anxious. Cognition and memory are normal. She does not exhibit a depressed mood.          Assessment & Plan:

## 2015-05-22 NOTE — Assessment & Plan Note (Addendum)
Due for re-eval. 

## 2015-05-22 NOTE — Assessment & Plan Note (Signed)
Stable, euvolemic on current regimen.

## 2015-05-22 NOTE — Patient Instructions (Addendum)
Stop at lab on way out. Check blood pressure at home daily for  X 1 week, then call with measurements. Goal <BP < 140/90. Keep working on healthy eating and walk as able. Due for yearly eye exam.

## 2015-05-22 NOTE — Progress Notes (Signed)
Pre visit review using our clinic review tool, if applicable. No additional management support is needed unless otherwise documented below in the visit note. 

## 2015-05-22 NOTE — Assessment & Plan Note (Signed)
Elevated despite increase in lisnopril to 40 mg daily, compliance not certain. Pt unable to repeat meds. Pt alone at [place].  Asked pt to follow Bps at home and ask family for assistance with meds. Pt refuses HH to help administer meds.

## 2015-05-23 ENCOUNTER — Encounter: Payer: Self-pay | Admitting: *Deleted

## 2015-05-23 LAB — COMPREHENSIVE METABOLIC PANEL
ALT: 16 U/L (ref 0–35)
AST: 21 U/L (ref 0–37)
Albumin: 3.6 g/dL (ref 3.5–5.2)
Alkaline Phosphatase: 59 U/L (ref 39–117)
BILIRUBIN TOTAL: 0.4 mg/dL (ref 0.2–1.2)
BUN: 26 mg/dL — ABNORMAL HIGH (ref 6–23)
CALCIUM: 9 mg/dL (ref 8.4–10.5)
CO2: 30 meq/L (ref 19–32)
Chloride: 105 mEq/L (ref 96–112)
Creatinine, Ser: 1.55 mg/dL — ABNORMAL HIGH (ref 0.40–1.20)
GFR: 40.52 mL/min — ABNORMAL LOW (ref >60.00)
GLUCOSE: 92 mg/dL (ref 70–99)
Potassium: 4.6 mEq/L (ref 3.5–5.1)
SODIUM: 140 meq/L (ref 135–145)
TOTAL PROTEIN: 6.1 g/dL (ref 6.0–8.3)

## 2015-05-23 LAB — CBC WITH DIFFERENTIAL/PLATELET
BASOS ABS: 0 10*3/uL (ref 0.0–0.1)
Basophils Relative: 0.5 % (ref 0.0–3.0)
EOS ABS: 0.2 10*3/uL (ref 0.0–0.7)
EOS PCT: 5.6 % — AB (ref 0.0–5.0)
HCT: 35.7 % — ABNORMAL LOW (ref 36.0–46.0)
Hemoglobin: 11.6 g/dL — ABNORMAL LOW (ref 12.0–15.0)
LYMPHS PCT: 26.9 % (ref 12.0–46.0)
Lymphs Abs: 1.2 10*3/uL (ref 0.7–4.0)
MCHC: 32.5 g/dL (ref 30.0–36.0)
MCV: 91.7 fl (ref 78.0–100.0)
MONO ABS: 0.3 10*3/uL (ref 0.1–1.0)
Monocytes Relative: 7.5 % (ref 3.0–12.0)
NEUTROS PCT: 59.5 % (ref 43.0–77.0)
Neutro Abs: 2.6 10*3/uL (ref 1.4–7.7)
PLATELETS: 60 10*3/uL — AB (ref 150.0–400.0)
RBC: 3.89 Mil/uL (ref 3.87–5.11)
RDW: 13.7 % (ref 11.5–15.5)
WBC: 4.4 10*3/uL (ref 4.0–10.5)

## 2015-05-23 LAB — HEMOGLOBIN A1C: Hgb A1c MFr Bld: 5.7 % (ref 4.6–6.5)

## 2015-06-20 ENCOUNTER — Other Ambulatory Visit: Payer: Self-pay | Admitting: *Deleted

## 2015-06-20 MED ORDER — FUROSEMIDE 20 MG PO TABS: ORAL_TABLET | ORAL | Status: DC

## 2015-07-14 ENCOUNTER — Other Ambulatory Visit: Payer: Self-pay | Admitting: *Deleted

## 2015-07-14 MED ORDER — FERROUS SULFATE 325 (65 FE) MG PO TABS: 325.0000 mg | ORAL_TABLET | Freq: Every day | ORAL | Status: AC

## 2015-07-14 MED ORDER — CARVEDILOL 3.125 MG PO TABS: 3.1250 mg | ORAL_TABLET | Freq: Two times a day (BID) | ORAL | Status: DC

## 2015-09-09 ENCOUNTER — Other Ambulatory Visit: Payer: Self-pay | Admitting: *Deleted

## 2015-09-09 MED ORDER — PRAVASTATIN SODIUM 20 MG PO TABS: 20.0000 mg | ORAL_TABLET | Freq: Every day | ORAL | Status: DC

## 2015-10-21 ENCOUNTER — Ambulatory Visit: Payer: Medicare Other | Admitting: Family Medicine

## 2015-10-22 ENCOUNTER — Telehealth: Payer: Self-pay | Admitting: Family Medicine

## 2015-10-22 NOTE — Telephone Encounter (Signed)
Pt did not come in for their appt on 10/21/15 for follow up. Please let me know if pt needs to be contacted immediately for follow up or no follow up needed. Best phone number to contact pt is [phone].

## 2015-10-23 NOTE — Telephone Encounter (Signed)
Yes needs to reschedule

## 2015-10-23 NOTE — Telephone Encounter (Signed)
Pt came in today, she was mistake of the date of appt. She is on your schedule for tomorrow.

## 2015-10-24 ENCOUNTER — Ambulatory Visit: Payer: Medicare Other | Admitting: Family Medicine

## 2015-10-24 DIAGNOSIS — Z0289 Encounter for other administrative examinations: Secondary | ICD-10-CM

## 2015-10-27 ENCOUNTER — Emergency Department (HOSPITAL_COMMUNITY): Payer: Medicare Other

## 2015-10-27 ENCOUNTER — Telehealth: Payer: Self-pay

## 2015-10-27 ENCOUNTER — Encounter (HOSPITAL_COMMUNITY): Payer: Self-pay

## 2015-10-27 ENCOUNTER — Inpatient Hospital Stay (HOSPITAL_COMMUNITY)
Admission: EM | Admit: 2015-10-27 | Discharge: 2015-10-29 | DRG: 309 | Disposition: A | Discharge status: Home / Self Care | Payer: Medicare Other | Attending: Interventional Cardiology | Admitting: Interventional Cardiology

## 2015-10-27 DIAGNOSIS — I495 Sick sinus syndrome: Secondary | ICD-10-CM | POA: Diagnosis present

## 2015-10-27 DIAGNOSIS — J431 Panlobular emphysema: Secondary | ICD-10-CM | POA: Diagnosis not present

## 2015-10-27 DIAGNOSIS — I248 Other forms of acute ischemic heart disease: Secondary | ICD-10-CM | POA: Diagnosis present

## 2015-10-27 DIAGNOSIS — R7989 Other specified abnormal findings of blood chemistry: Secondary | ICD-10-CM

## 2015-10-27 DIAGNOSIS — J438 Other emphysema: Secondary | ICD-10-CM

## 2015-10-27 DIAGNOSIS — I484 Atypical atrial flutter: Secondary | ICD-10-CM | POA: Diagnosis not present

## 2015-10-27 DIAGNOSIS — Z79899 Other long term (current) drug therapy: Secondary | ICD-10-CM

## 2015-10-27 DIAGNOSIS — I480 Paroxysmal atrial fibrillation: Secondary | ICD-10-CM | POA: Diagnosis not present

## 2015-10-27 DIAGNOSIS — I4892 Unspecified atrial flutter: Secondary | ICD-10-CM | POA: Diagnosis present

## 2015-10-27 DIAGNOSIS — N183 Chronic kidney disease, stage 3 unspecified: Secondary | ICD-10-CM | POA: Diagnosis present

## 2015-10-27 DIAGNOSIS — F0390 Unspecified dementia without behavioral disturbance: Secondary | ICD-10-CM | POA: Diagnosis present

## 2015-10-27 DIAGNOSIS — I10 Essential (primary) hypertension: Secondary | ICD-10-CM | POA: Diagnosis not present

## 2015-10-27 DIAGNOSIS — R778 Other specified abnormalities of plasma proteins: Secondary | ICD-10-CM

## 2015-10-27 DIAGNOSIS — I453 Trifascicular block: Secondary | ICD-10-CM

## 2015-10-27 DIAGNOSIS — I5042 Chronic combined systolic (congestive) and diastolic (congestive) heart failure: Secondary | ICD-10-CM | POA: Diagnosis present

## 2015-10-27 DIAGNOSIS — R0789 Other chest pain: Secondary | ICD-10-CM

## 2015-10-27 DIAGNOSIS — E1121 Type 2 diabetes mellitus with diabetic nephropathy: Secondary | ICD-10-CM | POA: Diagnosis present

## 2015-10-27 DIAGNOSIS — D638 Anemia in other chronic diseases classified elsewhere: Secondary | ICD-10-CM | POA: Diagnosis present

## 2015-10-27 DIAGNOSIS — I481 Persistent atrial fibrillation: Secondary | ICD-10-CM | POA: Diagnosis present

## 2015-10-27 DIAGNOSIS — J449 Chronic obstructive pulmonary disease, unspecified: Secondary | ICD-10-CM | POA: Diagnosis present

## 2015-10-27 DIAGNOSIS — E1122 Type 2 diabetes mellitus with diabetic chronic kidney disease: Secondary | ICD-10-CM | POA: Diagnosis present

## 2015-10-27 DIAGNOSIS — Z853 Personal history of malignant neoplasm of breast: Secondary | ICD-10-CM

## 2015-10-27 DIAGNOSIS — R079 Chest pain, unspecified: Secondary | ICD-10-CM | POA: Diagnosis present

## 2015-10-27 DIAGNOSIS — E785 Hyperlipidemia, unspecified: Secondary | ICD-10-CM | POA: Diagnosis present

## 2015-10-27 DIAGNOSIS — I130 Hypertensive heart and chronic kidney disease with heart failure and stage 1 through stage 4 chronic kidney disease, or unspecified chronic kidney disease: Secondary | ICD-10-CM | POA: Diagnosis present

## 2015-10-27 LAB — BASIC METABOLIC PANEL
Anion gap: 9 (ref 5–15)
BUN: 15 mg/dL (ref 6–20)
CALCIUM: 9.4 mg/dL (ref 8.9–10.3)
CHLORIDE: 105 mmol/L (ref 101–111)
CO2: 28 mmol/L (ref 22–32)
CREATININE: 1.42 mg/dL — AB (ref 0.44–1.00)
GFR calc Af Amer: 37 mL/min — ABNORMAL LOW (ref >60)
GFR calc non Af Amer: 32 mL/min — ABNORMAL LOW (ref >60)
GLUCOSE: 116 mg/dL — AB (ref 65–99)
Potassium: 3.6 mmol/L (ref 3.5–5.1)
Sodium: 142 mmol/L (ref 135–145)

## 2015-10-27 LAB — CBC WITH DIFFERENTIAL/PLATELET
BASOS PCT: 0 %
Basophils Absolute: 0 10*3/uL (ref 0.0–0.1)
Eosinophils Absolute: 0.2 10*3/uL (ref 0.0–0.7)
Eosinophils Relative: 5 %
HEMATOCRIT: 44.4 % (ref 36.0–46.0)
HEMOGLOBIN: 13.6 g/dL (ref 12.0–15.0)
LYMPHS ABS: 0.7 10*3/uL (ref 0.7–4.0)
LYMPHS PCT: 21 %
MCH: 28.8 pg (ref 26.0–34.0)
MCHC: 30.6 g/dL (ref 30.0–36.0)
MCV: 93.9 fL (ref 78.0–100.0)
MONO ABS: 0.2 10*3/uL (ref 0.1–1.0)
MONOS PCT: 7 %
NEUTROS ABS: 2.4 10*3/uL (ref 1.7–7.7)
Neutrophils Relative %: 67 %
Platelets: DECREASED 10*3/uL (ref 150–400)
RBC: 4.73 MIL/uL (ref 3.87–5.11)
RDW: 13.1 % (ref 11.5–15.5)
WBC: 3.5 10*3/uL — ABNORMAL LOW (ref 4.0–10.5)

## 2015-10-27 LAB — TSH: TSH: 1.657 u[IU]/mL (ref 0.350–4.500)

## 2015-10-27 LAB — I-STAT TROPONIN, ED: Troponin i, poc: 0.01 ng/mL (ref 0.00–0.08)

## 2015-10-27 LAB — GLUCOSE, CAPILLARY: GLUCOSE-CAPILLARY: 197 mg/dL — AB (ref 65–99)

## 2015-10-27 LAB — TROPONIN I: TROPONIN I: 0.05 ng/mL — AB (ref <0.031)

## 2015-10-27 LAB — MAGNESIUM: Magnesium: 2.6 mg/dL — ABNORMAL HIGH (ref 1.7–2.4)

## 2015-10-27 LAB — MRSA PCR SCREENING: MRSA by PCR: NEGATIVE

## 2015-10-27 MED ORDER — IPRATROPIUM-ALBUTEROL 0.5-2.5 (3) MG/3ML IN SOLN: 3.0000 mL | Freq: Four times a day (QID) | RESPIRATORY_TRACT | Status: DC | PRN

## 2015-10-27 MED ORDER — MAGNESIUM SULFATE 2 GM/50ML IV SOLN
2.0000 g | Freq: Once | INTRAVENOUS | Status: AC
  Administered 2015-10-27: 2 g via INTRAVENOUS
  Filled 2015-10-27: qty 50

## 2015-10-27 MED ORDER — ACETAMINOPHEN 325 MG PO TABS: 650.0000 mg | ORAL_TABLET | ORAL | Status: DC | PRN

## 2015-10-27 MED ORDER — INSULIN ASPART 100 UNIT/ML ~~LOC~~ SOLN
0.0000 [IU] | Freq: Three times a day (TID) | SUBCUTANEOUS | Status: DC
  Administered 2015-10-29: 1 [IU] via SUBCUTANEOUS

## 2015-10-27 MED ORDER — CARVEDILOL 6.25 MG PO TABS
6.2500 mg | ORAL_TABLET | Freq: Two times a day (BID) | ORAL | Status: DC
  Administered 2015-10-27: 6.25 mg via ORAL
  Filled 2015-10-27: qty 1

## 2015-10-27 MED ORDER — DONEPEZIL HCL 5 MG PO TABS
5.0000 mg | ORAL_TABLET | Freq: Every day | ORAL | Status: DC
  Administered 2015-10-27 – 2015-10-28 (×2): 5 mg via ORAL
  Filled 2015-10-27 (×4): qty 1

## 2015-10-27 MED ORDER — FERROUS SULFATE 325 (65 FE) MG PO TABS
325.0000 mg | ORAL_TABLET | Freq: Every day | ORAL | Status: DC
  Administered 2015-10-28 – 2015-10-29 (×2): 325 mg via ORAL
  Filled 2015-10-27: qty 1

## 2015-10-27 MED ORDER — APIXABAN 2.5 MG PO TABS
2.5000 mg | ORAL_TABLET | Freq: Two times a day (BID) | ORAL | Status: DC
  Administered 2015-10-27: 2.5 mg via ORAL
  Filled 2015-10-27: qty 1

## 2015-10-27 MED ORDER — PRAVASTATIN SODIUM 40 MG PO TABS
20.0000 mg | ORAL_TABLET | Freq: Every day | ORAL | Status: DC
  Administered 2015-10-28: 20 mg via ORAL
  Filled 2015-10-27: qty 1

## 2015-10-27 MED ORDER — MORPHINE SULFATE (PF) 2 MG/ML IV SOLN: 1.0000 mg | INTRAVENOUS | Status: DC | PRN

## 2015-10-27 MED ORDER — ASPIRIN 81 MG PO CHEW
162.0000 mg | CHEWABLE_TABLET | Freq: Once | ORAL | Status: AC
  Administered 2015-10-27: 162 mg via ORAL
  Filled 2015-10-27: qty 2

## 2015-10-27 MED ORDER — GI COCKTAIL ~~LOC~~: 30.0000 mL | Freq: Four times a day (QID) | ORAL | Status: DC | PRN

## 2015-10-27 MED ORDER — PANTOPRAZOLE SODIUM 40 MG PO TBEC
40.0000 mg | DELAYED_RELEASE_TABLET | Freq: Every day | ORAL | Status: DC
  Administered 2015-10-28 – 2015-10-29 (×2): 40 mg via ORAL
  Filled 2015-10-27 (×2): qty 1

## 2015-10-27 MED ORDER — ONDANSETRON HCL 4 MG/2ML IJ SOLN: 4.0000 mg | Freq: Four times a day (QID) | INTRAMUSCULAR | Status: DC | PRN

## 2015-10-27 MED ORDER — LISINOPRIL 20 MG PO TABS
40.0000 mg | ORAL_TABLET | Freq: Every day | ORAL | Status: DC
Start: 2015-10-28 — End: 2015-10-30
  Administered 2015-10-28 – 2015-10-29 (×2): 40 mg via ORAL
  Filled 2015-10-27 (×2): qty 2

## 2015-10-27 NOTE — ED Notes (Signed)
Admitting at bedside 

## 2015-10-27 NOTE — Telephone Encounter (Signed)
Pt walked in [facility] with chest tightness and chest pain that started within the hour while pt was preparing something to eat; pts niece who helps her is down [place] now and no one to call to transport pt to ED for eval. Called 911 and EMT will transport for eval. Now pt not having chest pain but still complains of chest tightness; pt said she had never had this feeling before and pt is concerned about how she presently feels and OKs calling 911 for transport. TP 98. 2 and P 88. BP 138/90 and pulse ox room air 97%. Pt not having sweating or nausea. EMT took BS 121.

## 2015-10-27 NOTE — Consult Note (Signed)
Patient ID: Monica Hurst MRN: [id number], DOB/AGE: February 26, 1924   Admit date: 10/27/2015   Primary Physician: Eliezer Lofts, MD Primary Cardiologist: Dr. Fletcher Anon  Pt. Profile:  81 y/o AAF with h/o atrial flutter s/p cardioversion in the past, chronic systolic CHF and HTN but no know h/o CAD presenting with chest pain.   Problem List  Past Medical History  Diagnosis Date  . Breast cancer ([place])     right-sided. s/p lumpectomy in 7/09. that was followed by mammoSite adjuvant radiation treatment.   . Mild memory disturbances not amounting to dementia   . HTN (hypertension)   . Diabetes type 2, controlled ([place])     if controlled not specified  . Obesity   . Hx of cholecystectomy   . Fibroid uterus     w/postmenopausal vaginal bleeding  . MENORRHAGIA, POSTMENOPAUSAL 02/06/2010    Qualifier: Diagnosis of  By: Diona Browner MD, Amy    . COPD (chronic obstructive pulmonary disease) ([place])   . Atrial flutter ([place])     a. new onset 08/2014; b. s/p successful DCCV 09/26/14 NSR briefly -->junctional rhythm into the 30s reuiring neo, bagging, & dopamine, HR improved with amio washout   . Chronic systolic CHF (congestive heart failure) ([place]) 2/10    a. echo 01/2009: mod LVH, EF 0000000, mild diastolic dysfunction, normal RV size/fxn, ? RA mass; b. echo 09/01/2014: EF 40-45%, severely dilated LA/RA, trival  global pericardial effusion, mod-severe MR, mod-severe TR,   . Kidney failure     Past Surgical History  Procedure Laterality Date  . Abnormal myoview  12/09     lexiscan myoview showed EF 70%. no wall motion abnormalities. there was an inferior defect that was likely artifact. possibly some anterior ischemia  . Breast lumpectomy  7/09  . Vaginal hysterectomy  2010    for post menopausal bleeding     Allergies  No Known Allergies  HPI  81 y/o female, followed by Dr. Fletcher Anon with a h/o atrial flutter and chronic systolic HF. She has known history of COPD, hypertension, type 2  diabetes,chronic kidney disease, chronic systolic heart failure and atrial flutter. She underwent  cardioversion on September 26, 2014 and converted to sinus rhythm. However, she had significant bradycardia with junctional rhythm in the 30s with hypotension. She was started on dopamine. Both metoprolol and amiodarone were discontinued. Echo showed EF 45%, severe biatrial enlargement with a left atrium of 54 mm, moderate MR and TR. Her LV dysfunction was felt her cardiomyopathy was 2/2 her arrhythmia. She has been tolerating small dose carvedilol. She is on Eliquis for stroke prophylaxis. There are also reports of mild dementia.  She presented to [facility] ED today with a complaint of chest pain. Symptoms started early this morning. She's never felt chest discomfort like this before. It was substernal and described as a dull ache. Non-radiating. No exacerbating or alleviating factors. It was not worse with exertion. She denies any other associated symptoms, including no dyspnea, diaphoresis, nausea, vomiting, seen to be/near-syncope. She did feel some mild palpitations. Her discomfort was constant and lasted nearly 2 hr before spontaneously resolving. She reports full medication compliance. Given her concerns she presented to an urgent care near [facility] and was advised to come to the ED.   EKG shows new RBBB. CXR is unremarkable. BP is elevated in the [id number] diastolic. Scr is 1.42.   Home Medications  Prior to Admission medications   Medication Sig Start Date End Date Taking? Authorizing Provider  apixaban (ELIQUIS) 2.5  MG TABS tablet Take 1 tablet (2.5 mg total) by mouth 2 (two) times daily. 04/28/15  Yes Wellington Hampshire, MD  carvedilol (COREG) 3.125 MG tablet Take 1 tablet (3.125 mg total) by mouth 2 (two) times daily. 07/14/15  Yes Wellington Hampshire, MD  donepezil (ARICEPT) 5 MG tablet Take 1 tablet (5 mg total) by mouth at bedtime. 10/22/14  Yes Amy Cletis Athens, MD  ferrous sulfate 325 (65 FE) MG tablet Take 1  tablet (325 mg total) by mouth daily with breakfast. 07/14/15  Yes Areta Haber Dunn, PA-C  furosemide (LASIX) 20 MG tablet Take 2 tablets by mouth daily as needed for fluid or edema (For weight Gain >3 pounds) Patient taking differently: Take 20 mg by mouth daily. Take 1 tablet by mouth daily as needed for fluid or edema (For weight Gain >3 pounds) 06/20/15  Yes Wellington Hampshire, MD  lisinopril (PRINIVIL,ZESTRIL) 40 MG tablet Take 1 tablet (40 mg total) by mouth daily. 04/28/15  Yes Wellington Hampshire, MD  pravastatin (PRAVACHOL) 20 MG tablet Take 1 tablet (20 mg total) by mouth daily. 09/09/15  Yes Wellington Hampshire, MD  BAYER MICROLET LANCETS lancets Use to check fasting blood sugar daily.  Dx: E11.8 10/22/14   Jinny Sanders, MD  glucose blood (BAYER CONTOUR NEXT TEST) test strip Use to check fasting blood sugar daily.  Dx: E11.8 10/22/14   Jinny Sanders, MD    Family History  Family History  Problem Relation Age of Onset  . Coronary artery disease Neg Hx     no premature CAD  . Hypertension Mother   . Hypertension Father   . Cancer Sister     breast    Social History  Social History   Social History  . Marital Status: Widowed    Spouse Name: N/A  . Number of Children: N/A  . Years of Education: N/A   Occupational History  . Not on file.   Social History Main Topics  . Smoking status: Never Smoker   . Smokeless tobacco: Never Used     Comment: does not smoke  . Alcohol Use: No  . Drug Use: No  . Sexual Activity: Not on file   Other Topics Concern  . Not on file   Social History Narrative   Pt lives in [place] with her sister. She has no children but does have nieces and nephews.      Review of Systems General:  No chills, fever, night sweats or weight changes.  Cardiovascular:  No chest pain, dyspnea on exertion, edema, orthopnea, palpitations, paroxysmal nocturnal dyspnea. Dermatological: No rash, lesions/masses Respiratory: No cough, dyspnea Urologic: No hematuria,  dysuria Abdominal:   No nausea, vomiting, diarrhea, bright red blood per rectum, melena, or hematemesis Neurologic:  No visual changes, wkns, changes in mental status. All other systems reviewed and are otherwise negative except as noted above.  Physical Exam  Blood pressure 138/107, pulse 100, temperature 98.1 F (36.7 C), temperature source Oral, resp. rate 21, height 5\' 5"  (1.651 m), weight 166 lb (75.297 kg), SpO2 100 %.  General: Pleasant, NAD Psych: Normal affect. Neuro: Alert and oriented X 3. Moves all extremities spontaneously. HEENT: Normal  Neck: Supple without bruits or JVD. Lungs:  Resp regular and unlabored, CTA. Heart: RRR no s3, s4, or murmurs. Abdomen: Soft, non-tender, non-distended, BS + x 4.  Extremities: No clubbing, cyanosis or edema. DP/PT/Radials 2+ and equal bilaterally.  Labs  Troponin [facility] of Care Test)  Recent  Labs  10/27/15 1444  TROPIPOC 0.01   No results for input(s): CKTOTAL, CKMB, TROPONINI in the last 72 hours. Lab Results  Component Value Date   WBC 3.5* 10/27/2015   HGB 13.6 10/27/2015   HCT 44.4 10/27/2015   MCV 93.9 10/27/2015   PLT  10/27/2015    PLATELET CLUMPS NOTED ON SMEAR, COUNT APPEARS DECREASED    Recent Labs Lab 10/27/15 1445  NA 142  K 3.6  CL 105  CO2 28  BUN 15  CREATININE 1.42*  CALCIUM 9.4  GLUCOSE 116*   Lab Results  Component Value Date   CHOL 160 01/15/2015   HDL 62.20 01/15/2015   LDLCALC 84 01/15/2015   TRIG 68.0 01/15/2015   No results found for: DDIMER   Radiology/Studies  Dg Chest Portable 1 View  10/27/2015  CLINICAL DATA:  Chest pain EXAM: PORTABLE CHEST 1 VIEW COMPARISON:  10/14/2014 FINDINGS: Mild cardiomegaly. Lungs are clear. No pneumothorax. No pleural effusion. IMPRESSION: No active cardiopulmonary disease. Electronically Signed   By: Marybelle Killings M.D.   On: 10/27/2015 14:37    ECG  New Right bundle branch block    ASSESSMENT AND PLAN  The patient has no prior history of  CAD,  But does have a history of atrial flutter, chronic systolic heart failure and hypertension. She notes recent vague CP symptoms that are somewhat atypical. EKG shows a new right bundle branch block. Point of care troponin is normal. Agree with admitting to observation for rule out. Continue to cycle cardiac enzymes. Make nothing by mouth at midnight. Plan for stress test in the a.m. if negative cardiac enzymes. Continue eliquis for stroke prophylaxis given her history of atrial flutter. Continue low-dose carvedilol and lisinopril for LV dysfunction. Continue statin.   Signed, Lyda Jester, PA-C 10/27/2015, 5:50 PM

## 2015-10-27 NOTE — ED Notes (Signed)
MD at bedside at this time.

## 2015-10-27 NOTE — Telephone Encounter (Signed)
Reasonable POC

## 2015-10-27 NOTE — Progress Notes (Signed)
Pt had 8 sec conversion pause to AV block then sinus brady and was apparently unresponsive. RN called this NP but cardiologist already at the bedside. NP spoke to Dr. Tommi Rumps, cardio fellow, about the pt. Pt will be transferred to ICU status on 2H for closer monitoring and will be seen by EP in am for possible PM insertion. Dr. Tommi Rumps putting in further orders. Will continue to monitor pt tonight and call cardio should pt become more unstable or have further pauses/block issues. NP called Dr. Lamonte Sakai of [facility] to make aware of ICU transfer. Clance Boll, NP [facility]

## 2015-10-27 NOTE — ED Notes (Signed)
Patient arrived by EMS from [facility] for CP. The pain is described as substernal non-radiating that lasted 5 minutes, no othert associated symptoms. Pain free once she arrived at the doctors office and pain free on EMS transport

## 2015-10-27 NOTE — ED Notes (Signed)
MD at bedside. 

## 2015-10-27 NOTE — Progress Notes (Addendum)
After pt was put on the bedside commode, Pt had an episode where she lost conscience for about 1 min, she also lost her pulse for a few second. Just before CPR was initiated she regained consciousness and pulse. Pt is now alert and follows command. On the Bedside monitor she is currently showing sinus bradycardia with an HR in the 40's , BP is 113/65. Rapid response and MD came prumptly d/t code being initiated .Internal Medicine residents and Cardiology fellow assessed pt and ordered transfer to ICU.  Ferdinand Lango, RN

## 2015-10-27 NOTE — Significant Event (Addendum)
Paged re: 8.77s post conversion pause. Patient was in rate controlled AFL on 6.25mg  BID coreg and she spontaneously converted. She felt well before and had LOC. She awoke before any compressions were performed.   ECG after event was SB in 40s with 1st degree AVB, RBBB, and LAFB.   113/65  On my exam she was awake and alert feeling well.  Heart was regular with no MRG  Impression She has substantial conduction system disease and would be appropriate for pacemaker consideration in the AM.  Plan 1. Transfer to [facility] ICU bed 2. Discontinue cogreg 3. Discontinue apixaban; she received this evenings dose and can be considered for IV UFH while awaiting probable PPM (depending on plan) in AM 4. TTE in AM 5. Will monitor closely for need for temporary PPM 6. NPO 7. Keep pads on patient  12:30am rhythm continues to be SB around 40. No pauses since transfer to [facility]

## 2015-10-27 NOTE — Progress Notes (Signed)
Code Blue called at 2058 on Pt after floor RN found Pt asystole on bedside commode. Upon my arrival at 2100 Pt back in bed, awake following commands. Code blue canceled. Per floor RN, Patient woke up upon placement back in bed. No chest compressions or medications given. Pt follows commands, lethargic but able to carry conversation, oriented X 4, denies chest pain, SOB, or dizziness. Heart rhythm with wide BBB on zole, EKG completed1st degree AVB, RBBB. Cardiology and [facility] teams paged. Dr. Tommi Rumps Cardiologist at bedside 2120. Orders received to transfer Pt to 2 H. Pt transferred to 2 H 08 at 2200, report provided per floor RN.

## 2015-10-27 NOTE — H&P (Signed)
[facility] History and Physical  Shreshta Tessa Lerner QE:[id number] DOB: [birth year] DOA: 10/27/2015  Referring physician: Dr. Alfonse Spruce  PCP: Eliezer Lofts, MD   Chief Complaint: Chest pain  HPI: Monica Hurst is a 81 y.o. female with a past medical history significant for hypertension, hyperlipidemia, diabetes mellitus (diet control currently), chronic kidney disease stage III, chronic diastolic heart failure (last ejection fraction 40-45 % in 2015), paroxysmal atrial fibrillation (on Eliquis), anemia of chronic disease and COPD; who presented to the emergency department secondary to chest pain. Patient reports pain in the middle of her chest substernally in location, 6-7/10; With mild radiation to her left side, lasting approximately 10-15 minutes and described as tightness/sqeezing in nature. Patient received aspirin and by the time that she was seen in the emergency department she was chest pain-free. She denies any palpitations, shortness of breath, nausea, vomiting, diaphoresis or any other associated symptoms when the pain was present.  Patient also denies cough, fever, chills, dysuria/hematuria, abdominal pain, hematochezia, melena, headaches, blurred vision or any focal weakness/deficit. In the ED patient EKG with new complete right bundle branch block and first troponin negative. Patient has a heart score of 5-6 and [facility] has been called to admit the patient for further evaluation and treatment. Her chest x-ray demonstrated no acute cardiopulmonary process.  Review of Systems:  Negative except as otherwise mentioned in history of present illness.  Past Medical History  Diagnosis Date  . Breast cancer ([place])     right-sided. s/p lumpectomy in 7/09. that was followed by mammoSite adjuvant radiation treatment.   . Mild memory disturbances not amounting to dementia   . HTN (hypertension)   . Diabetes type 2, controlled ([place])     if controlled not specified  .  Obesity   . Hx of cholecystectomy   . Fibroid uterus     w/postmenopausal vaginal bleeding  . MENORRHAGIA, POSTMENOPAUSAL 02/06/2010    Qualifier: Diagnosis of  By: Diona Browner MD, Amy    . COPD (chronic obstructive pulmonary disease) ([place])   . Atrial flutter ([place])     a. new onset 08/2014; b. s/p successful DCCV 09/26/14 NSR briefly -->junctional rhythm into the 30s reuiring neo, bagging, & dopamine, HR improved with amio washout   . Chronic systolic CHF (congestive heart failure) ([place]) 2/10    a. echo 01/2009: mod LVH, EF 0000000, mild diastolic dysfunction, normal RV size/fxn, ? RA mass; b. echo 09/01/2014: EF 40-45%, severely dilated LA/RA, trival  global pericardial effusion, mod-severe MR, mod-severe TR,   . Kidney failure    Past Surgical History  Procedure Laterality Date  . Abnormal myoview  12/09     lexiscan myoview showed EF 70%. no wall motion abnormalities. there was an inferior defect that was likely artifact. possibly some anterior ischemia  . Breast lumpectomy  7/09  . Vaginal hysterectomy  2010    for post menopausal bleeding   Social History:  reports that she has never smoked. She has never used smokeless tobacco. She reports that she does not drink alcohol or use illicit drugs.  No Known Allergies  Family History  Problem Relation Age of Onset  . Coronary artery disease Neg Hx     no premature CAD  . Hypertension Mother   . Hypertension Father   . Cancer Sister     breast    Prior to Admission medications   Medication Sig Start Date End Date Taking? Authorizing Provider  apixaban (ELIQUIS) 2.5 MG TABS tablet Take 1 tablet (  2.5 mg total) by mouth 2 (two) times daily. 04/28/15  Yes Wellington Hampshire, MD  carvedilol (COREG) 3.125 MG tablet Take 1 tablet (3.125 mg total) by mouth 2 (two) times daily. 07/14/15  Yes Wellington Hampshire, MD  donepezil (ARICEPT) 5 MG tablet Take 1 tablet (5 mg total) by mouth at bedtime. 10/22/14  Yes Amy Cletis Athens, MD  ferrous sulfate 325 (65 FE)  MG tablet Take 1 tablet (325 mg total) by mouth daily with breakfast. 07/14/15  Yes Areta Haber Dunn, PA-C  furosemide (LASIX) 20 MG tablet Take 2 tablets by mouth daily as needed for fluid or edema (For weight Gain >3 pounds) Patient taking differently: Take 20 mg by mouth daily. Take 1 tablet by mouth daily as needed for fluid or edema (For weight Gain >3 pounds) 06/20/15  Yes Wellington Hampshire, MD  lisinopril (PRINIVIL,ZESTRIL) 40 MG tablet Take 1 tablet (40 mg total) by mouth daily. 04/28/15  Yes Wellington Hampshire, MD  pravastatin (PRAVACHOL) 20 MG tablet Take 1 tablet (20 mg total) by mouth daily. 09/09/15  Yes Wellington Hampshire, MD  BAYER MICROLET LANCETS lancets Use to check fasting blood sugar daily.  Dx: E11.8 10/22/14   Jinny Sanders, MD  glucose blood (BAYER CONTOUR NEXT TEST) test strip Use to check fasting blood sugar daily.  Dx: E11.8 10/22/14   Jinny Sanders, MD   Physical Exam: Filed Vitals:   10/27/15 1435 10/27/15 1530 10/27/15 1615 10/27/15 1700  BP: 144/105 152/103 137/89 138/107  Pulse: 95 98 98 100  Temp:      TempSrc:      Resp:  20 19 21   Height:      Weight:      SpO2: 99% 100% 100% 100%    Wt Readings from Last 3 Encounters:  10/27/15 75.297 kg (166 lb)  05/22/15 75.524 kg (166 lb 8 oz)  04/28/15 71.555 kg (157 lb 12 oz)    General:  Appears calm and comfortable, currently chest pain-free; no palpitations, shortness of breath or orthopnea. Patient denies any coughing spells and fever Eyes: PERRL, normal lids, irises & conjunctiva, no icterus ENT: grossly normal hearing, lips & tongue, no thrush, no erythema or exudates. There is no drainage out of her ears or nostrils Neck: no LAD, masses or thyromegaly, no bruits, no JVD Cardiovascular: RRR, no m/r/g. No LE edema. Telemetry: SR, no arrhythmias (on telemetry box in the ED and EKG) Respiratory: CTA bilaterally, no w/r/r. Normal respiratory effort. Abdomen: soft, nt, positive bowel sounds, nd Skin: no rash or induration  seen on limited exam Musculoskeletal: grossly normal tone BUE/BLE, full range of motion, no joint swelling Psychiatric: grossly normal mood and affect, speech fluent and appropriate Neurologic: grossly non-focal.          Labs on Admission:  Basic Metabolic Panel:  Recent Labs Lab 10/27/15 1445  NA 142  K 3.6  CL 105  CO2 28  GLUCOSE 116*  BUN 15  CREATININE 1.42*  CALCIUM 9.4   CBC:  Recent Labs Lab 10/27/15 1445  WBC 3.5*  NEUTROABS 2.4  HGB 13.6  HCT 44.4  MCV 93.9  PLT PLATELET CLUMPS NOTED ON SMEAR, COUNT APPEARS DECREASED   Radiological Exams on Admission: Dg Chest Portable 1 View  10/27/2015  CLINICAL DATA:  Chest pain EXAM: PORTABLE CHEST 1 VIEW COMPARISON:  10/14/2014 FINDINGS: Mild cardiomegaly. Lungs are clear. No pneumothorax. No pleural effusion. IMPRESSION: No active cardiopulmonary disease. Electronically Signed   By: Arnell Sieving  Hoss M.D.   On: 10/27/2015 14:37    EKG:  Sinus rhythm, rate control; new complete right bundle branch block. No other acute ischemic changes  Assessment/Plan 1-Chest pain: Typical presentation and multiple risk factors. Patient heart score 5-6; EKG with new complete right bundle branch block. First troponin neg -will admit to telemetry -cycle troponin -will check echo -EKG in am -cardiology consulted for stress test stratification at least (and help with decision of inpatient vs outpatient workup) -continue coreg, lisinopril and statins -will use PRN morphine for pain -started on PPI -CXR w/o acute cardiopulmonary process  2-Paroxysmal atrial fibrillation [facility]): currently sinus -will continue B-blocker -continue eliquis -will monitor on telemetry  3-Chronic renal disease, stage 3, moderately decreased glomerular filtration rate between 30-59 mL/min/1.73 square meter -stable and at baseline -will monitor trend  4-Anemia of chronic disease:  -Hgb stable -no signs of overt bleeding on exam -will continue ferrous  sulfate  5-COPD (chronic obstructive pulmonary disease) ([place]): -no wheezing -will use duoneb PRN  6-Diabetes mellitus with nephropathy (HCC) Controlled by diet at home -will check A1C -use SSI and follow CBG's  7-HLD (hyperlipidemia): -check lipid profile -statins  8-Benign essential HTN -Blood pressure slightly elevated -Will continue coreg, lisinopril; first one adjusted for better control -Follow blood pressure and adjust antihypertensive regimen as needed  9-chronic combine systolic/diastolic heart failure: Last ejection fraction  40-45% -No signs of fluid overload -Will continue beta blocker and ACE inhibitor -Daily weights and strict intake and output daily -Patient will be allowed to have a low-sodium diet -She uses Lasix on as-needed basis  -Will be holding on diuretics for now.    Cardiology (Dr. Tamala Julian)  Code Status: Full code DVT Prophylaxis: Patient chronically on Eliquis (these will satisfied DVT prophylaxis) Family Communication: No family at bedside Disposition Plan: Observation; telemetry bed, LOS less than 2 midnights   Time spent: 65 minutes  Barton Dubois [facility] Pager [phone]

## 2015-10-27 NOTE — ED Notes (Signed)
Admitting at bedside, instructed NT not to take pt up until MD can come.

## 2015-10-27 NOTE — ED Provider Notes (Signed)
CSN: [id number]     Arrival date & time 10/27/15  1350 History   First MD Initiated Contact with Patient 10/27/15 1405     Chief Complaint  Patient presents with  . Chest Pain     (Consider location/radiation/quality/duration/timing/severity/associated sxs/prior Treatment) HPI Comments: 81 y.o. Female with history of HTN, atrial flutter s/p cardioversion, CHF, DM presents for chest pain.  The patient reports that she was preparing to make herself lunch at home around noon when she had the sudden onset of left chest pain that did not radiate.  It lasted about 5 minutes per the patient and she did have some mild associated shortness of breath.  She proceeded to drive herself to her PCP's office where EMS were called and she was brought to the ER for evaluation.  Currently denies chest pain, tightness, shortness of breath.     Past Medical History  Diagnosis Date  . Breast cancer ([place])     right-sided. s/p lumpectomy in 7/09. that was followed by mammoSite adjuvant radiation treatment.   . Mild memory disturbances not amounting to dementia   . HTN (hypertension)   . Diabetes type 2, controlled ([place])     if controlled not specified  . Obesity   . Hx of cholecystectomy   . Fibroid uterus     w/postmenopausal vaginal bleeding  . MENORRHAGIA, POSTMENOPAUSAL 02/06/2010    Qualifier: Diagnosis of  By: Diona Browner MD, Amy    . COPD (chronic obstructive pulmonary disease) ([place])   . Atrial flutter ([place])     a. new onset 08/2014; b. s/p successful DCCV 09/26/14 NSR briefly -->junctional rhythm into the 30s reuiring neo, bagging, & dopamine, HR improved with amio washout   . Chronic systolic CHF (congestive heart failure) ([place]) 2/10    a. echo 01/2009: mod LVH, EF 0000000, mild diastolic dysfunction, normal RV size/fxn, ? RA mass; b. echo 09/01/2014: EF 40-45%, severely dilated LA/RA, trival  global pericardial effusion, mod-severe MR, mod-severe TR,   . Kidney failure    Past Surgical History  Procedure  Laterality Date  . Abnormal myoview  12/09     lexiscan myoview showed EF 70%. no wall motion abnormalities. there was an inferior defect that was likely artifact. possibly some anterior ischemia  . Breast lumpectomy  7/09  . Vaginal hysterectomy  2010    for post menopausal bleeding   Family History  Problem Relation Age of Onset  . Coronary artery disease Neg Hx     no premature CAD  . Hypertension Mother   . Hypertension Father   . Cancer Sister     breast   Social History  Substance Use Topics  . Smoking status: Never Smoker   . Smokeless tobacco: Never Used     Comment: does not smoke  . Alcohol Use: No   OB History    No data available     Review of Systems  Constitutional: Negative for fever, chills and fatigue.  HENT: Negative for congestion, postnasal drip and rhinorrhea.   Eyes: Negative for pain and redness.  Respiratory: Negative for cough, chest tightness and shortness of breath.   Cardiovascular: Positive for chest pain (resolved). Negative for palpitations.  Gastrointestinal: Negative for vomiting, abdominal pain, diarrhea and constipation.  Genitourinary: Negative for dysuria, urgency and frequency.  Musculoskeletal: Negative for myalgias and back pain.  Skin: Negative for rash.  Neurological: Negative for dizziness, weakness and headaches.  Hematological: Bruises/bleeds easily.      Allergies  Review of patient's  allergies indicates no known allergies.  Home Medications   Prior to Admission medications   Medication Sig Start Date End Date Taking? Authorizing Provider  apixaban (ELIQUIS) 2.5 MG TABS tablet Take 1 tablet (2.5 mg total) by mouth 2 (two) times daily. 04/28/15  Yes Wellington Hampshire, MD  carvedilol (COREG) 3.125 MG tablet Take 1 tablet (3.125 mg total) by mouth 2 (two) times daily. 07/14/15  Yes Wellington Hampshire, MD  donepezil (ARICEPT) 5 MG tablet Take 1 tablet (5 mg total) by mouth at bedtime. 10/22/14  Yes Amy Cletis Athens, MD  ferrous  sulfate 325 (65 FE) MG tablet Take 1 tablet (325 mg total) by mouth daily with breakfast. 07/14/15  Yes Areta Haber Dunn, PA-C  furosemide (LASIX) 20 MG tablet Take 2 tablets by mouth daily as needed for fluid or edema (For weight Gain >3 pounds) Patient taking differently: Take 20 mg by mouth daily. Take 1 tablet by mouth daily as needed for fluid or edema (For weight Gain >3 pounds) 06/20/15  Yes Wellington Hampshire, MD  lisinopril (PRINIVIL,ZESTRIL) 40 MG tablet Take 1 tablet (40 mg total) by mouth daily. 04/28/15  Yes Wellington Hampshire, MD  pravastatin (PRAVACHOL) 20 MG tablet Take 1 tablet (20 mg total) by mouth daily. 09/09/15  Yes Wellington Hampshire, MD  BAYER MICROLET LANCETS lancets Use to check fasting blood sugar daily.  Dx: E11.8 10/22/14   Jinny Sanders, MD  glucose blood (BAYER CONTOUR NEXT TEST) test strip Use to check fasting blood sugar daily.  Dx: E11.8 10/22/14   Amy E Diona Browner, MD   BP 138/107 mmHg  Pulse 100  Temp(Src) 98.1 F (36.7 C) (Oral)  Resp 21  Ht 5\' 5"  (1.651 m)  Wt 166 lb (75.297 kg)  BMI 27.62 kg/m2  SpO2 100% Physical Exam  Constitutional: She is oriented to person, place, and time. She appears well-developed and well-nourished. No distress.  HENT:  Head: Normocephalic and atraumatic.  Right Ear: External ear normal.  Left Ear: External ear normal.  Nose: Nose normal.  Mouth/Throat: Oropharynx is clear and moist. No oropharyngeal exudate.  Eyes: EOM are normal. Pupils are equal, round, and reactive to light.  Neck: Normal range of motion. Neck supple.  Cardiovascular: Normal rate, regular rhythm and intact distal pulses.   Pulmonary/Chest: Effort normal. No respiratory distress. She has no wheezes. She has no rales.  Abdominal: Soft. She exhibits no distension. There is no tenderness.  Musculoskeletal: Normal range of motion. She exhibits no edema or tenderness.  Neurological: She is alert and oriented to person, place, and time.  Skin: Skin is warm and dry. No rash  noted. She is not diaphoretic.  Vitals reviewed.   ED Course  Procedures (including critical care time) Labs Review Labs Reviewed  CBC WITH DIFFERENTIAL/PLATELET - Abnormal; Notable for the following:    WBC 3.5 (*)    All other components within normal limits  BASIC METABOLIC PANEL - Abnormal; Notable for the following:    Glucose, Bld 116 (*)    Creatinine, Ser 1.42 (*)    GFR calc non Af Amer 32 (*)    GFR calc Af Amer 37 (*)    All other components within normal limits  I-STAT TROPOININ, ED    Imaging Review Dg Chest Portable 1 View  10/27/2015  CLINICAL DATA:  Chest pain EXAM: PORTABLE CHEST 1 VIEW COMPARISON:  10/14/2014 FINDINGS: Mild cardiomegaly. Lungs are clear. No pneumothorax. No pleural effusion. IMPRESSION: No active cardiopulmonary disease.  Electronically Signed   By: Marybelle Killings M.D.   On: 10/27/2015 14:37   I have personally reviewed and evaluated these images and lab results as part of my medical decision-making.   EKG Interpretation   Date/Time:  Monday October 27 2015 13:57:38 EST Ventricular Rate:  96 PR Interval:  182 QRS Duration: 139 QT Interval:  411 QTC Calculation: 519 R Axis:   -96 Text Interpretation:  Sinus or ectopic atrial rhythm RBBB and LAFB New  complete RBBB compared to previous Confirmed by Faaris Arizpe ([id number]) on  10/27/2015 2:31:06 PM      MDM  Patient seen and evaluated in stable condition.  EKG with complete RBBB.  Reviewed on the phone with Dr. Tamala Julian who felt that this is a complete RBBB from partial block previously.  Troponin normal. ASA given.  Chest xray unremarkable. Cr mildly elevated.  Discussed with Dr. Dyann Kief who agreed with admission and patient was admitted under his care with telemetry for cardiac rule out.  Patient updated on results and plan of care. Final diagnoses:  Chest pain, unspecified chest pain type   1. Chest pain    Harvel Quale, MD 10/27/15 [phone]

## 2015-10-28 ENCOUNTER — Ambulatory Visit (HOSPITAL_COMMUNITY): Payer: Medicare Other

## 2015-10-28 DIAGNOSIS — R001 Bradycardia, unspecified: Secondary | ICD-10-CM | POA: Diagnosis not present

## 2015-10-28 DIAGNOSIS — D638 Anemia in other chronic diseases classified elsewhere: Secondary | ICD-10-CM | POA: Diagnosis present

## 2015-10-28 DIAGNOSIS — R7989 Other specified abnormal findings of blood chemistry: Secondary | ICD-10-CM

## 2015-10-28 DIAGNOSIS — Z79899 Other long term (current) drug therapy: Secondary | ICD-10-CM | POA: Diagnosis not present

## 2015-10-28 DIAGNOSIS — I5042 Chronic combined systolic (congestive) and diastolic (congestive) heart failure: Secondary | ICD-10-CM | POA: Diagnosis present

## 2015-10-28 DIAGNOSIS — J410 Simple chronic bronchitis: Secondary | ICD-10-CM

## 2015-10-28 DIAGNOSIS — I453 Trifascicular block: Secondary | ICD-10-CM | POA: Diagnosis present

## 2015-10-28 DIAGNOSIS — R778 Other specified abnormalities of plasma proteins: Secondary | ICD-10-CM

## 2015-10-28 DIAGNOSIS — I10 Essential (primary) hypertension: Secondary | ICD-10-CM | POA: Diagnosis not present

## 2015-10-28 DIAGNOSIS — I248 Other forms of acute ischemic heart disease: Secondary | ICD-10-CM | POA: Diagnosis present

## 2015-10-28 DIAGNOSIS — R079 Chest pain, unspecified: Secondary | ICD-10-CM | POA: Diagnosis present

## 2015-10-28 DIAGNOSIS — I481 Persistent atrial fibrillation: Secondary | ICD-10-CM | POA: Diagnosis present

## 2015-10-28 DIAGNOSIS — R072 Precordial pain: Secondary | ICD-10-CM

## 2015-10-28 DIAGNOSIS — I495 Sick sinus syndrome: Secondary | ICD-10-CM

## 2015-10-28 DIAGNOSIS — Z853 Personal history of malignant neoplasm of breast: Secondary | ICD-10-CM | POA: Diagnosis not present

## 2015-10-28 DIAGNOSIS — F0390 Unspecified dementia without behavioral disturbance: Secondary | ICD-10-CM | POA: Diagnosis present

## 2015-10-28 DIAGNOSIS — E1121 Type 2 diabetes mellitus with diabetic nephropathy: Secondary | ICD-10-CM | POA: Diagnosis present

## 2015-10-28 DIAGNOSIS — I4892 Unspecified atrial flutter: Secondary | ICD-10-CM | POA: Diagnosis present

## 2015-10-28 DIAGNOSIS — N183 Chronic kidney disease, stage 3 (moderate): Secondary | ICD-10-CM | POA: Diagnosis present

## 2015-10-28 DIAGNOSIS — I130 Hypertensive heart and chronic kidney disease with heart failure and stage 1 through stage 4 chronic kidney disease, or unspecified chronic kidney disease: Secondary | ICD-10-CM | POA: Diagnosis present

## 2015-10-28 DIAGNOSIS — E1122 Type 2 diabetes mellitus with diabetic chronic kidney disease: Secondary | ICD-10-CM | POA: Diagnosis present

## 2015-10-28 DIAGNOSIS — I480 Paroxysmal atrial fibrillation: Secondary | ICD-10-CM | POA: Diagnosis present

## 2015-10-28 DIAGNOSIS — J449 Chronic obstructive pulmonary disease, unspecified: Secondary | ICD-10-CM | POA: Diagnosis present

## 2015-10-28 DIAGNOSIS — E785 Hyperlipidemia, unspecified: Secondary | ICD-10-CM | POA: Diagnosis present

## 2015-10-28 LAB — BASIC METABOLIC PANEL
Anion gap: 9 (ref 5–15)
BUN: 23 mg/dL — AB (ref 6–20)
CHLORIDE: 104 mmol/L (ref 101–111)
CO2: 31 mmol/L (ref 22–32)
Calcium: 9.2 mg/dL (ref 8.9–10.3)
Creatinine, Ser: 1.64 mg/dL — ABNORMAL HIGH (ref 0.44–1.00)
GFR calc Af Amer: 31 mL/min — ABNORMAL LOW (ref >60)
GFR calc non Af Amer: 27 mL/min — ABNORMAL LOW (ref >60)
GLUCOSE: 102 mg/dL — AB (ref 65–99)
POTASSIUM: 4 mmol/L (ref 3.5–5.1)
Sodium: 144 mmol/L (ref 135–145)

## 2015-10-28 LAB — CBC
HEMATOCRIT: 38.8 % (ref 36.0–46.0)
Hemoglobin: 12.1 g/dL (ref 12.0–15.0)
MCH: 29.2 pg (ref 26.0–34.0)
MCHC: 31.2 g/dL (ref 30.0–36.0)
MCV: 93.7 fL (ref 78.0–100.0)
Platelets: 122 10*3/uL — ABNORMAL LOW (ref 150–400)
RBC: 4.14 MIL/uL (ref 3.87–5.11)
RDW: 13.3 % (ref 11.5–15.5)
WBC: 4.7 10*3/uL (ref 4.0–10.5)

## 2015-10-28 LAB — LIPID PANEL
Cholesterol: 202 mg/dL — ABNORMAL HIGH (ref 0–200)
HDL: 57 mg/dL (ref >40)
LDL CALC: 128 mg/dL — AB (ref 0–99)
Total CHOL/HDL Ratio: 3.5 RATIO
Triglycerides: 86 mg/dL (ref <150)
VLDL: 17 mg/dL (ref 0–40)

## 2015-10-28 LAB — HEMOGLOBIN A1C
HEMOGLOBIN A1C: 6 % — AB (ref 4.8–5.6)
Mean Plasma Glucose: 126 mg/dL

## 2015-10-28 LAB — TROPONIN I
Troponin I: 0.06 ng/mL — ABNORMAL HIGH (ref <0.031)
Troponin I: 0.06 ng/mL — ABNORMAL HIGH (ref <0.031)

## 2015-10-28 LAB — GLUCOSE, CAPILLARY
GLUCOSE-CAPILLARY: 86 mg/dL (ref 65–99)
GLUCOSE-CAPILLARY: 75 mg/dL (ref 65–99)
GLUCOSE-CAPILLARY: 101 mg/dL — AB (ref 65–99)

## 2015-10-28 MED ORDER — AMLODIPINE BESYLATE 5 MG PO TABS
5.0000 mg | ORAL_TABLET | Freq: Every day | ORAL | Status: DC
  Administered 2015-10-28 – 2015-10-29 (×2): 5 mg via ORAL
  Filled 2015-10-28 (×2): qty 1

## 2015-10-28 NOTE — Progress Notes (Signed)
Spoke with patient's niece who is coming today around 3PM. Explained need for pacemaker. She agrees that patient needs pacemaker but would like for patient to make the decision.  Will have Dr Lovena Le discuss with patient and niece this afternoon. PPM implant cancelled for today - will reschedule for tomorrow. Orders not yet entered pending patient's consent to procedure.   Chanetta Marshall, NP 10/28/2015 1:03 PM

## 2015-10-28 NOTE — Consult Note (Signed)
ELECTROPHYSIOLOGY CONSULT NOTE    Patient ID: Monica Hurst MRN: [id number], DOB/AGE: [birth year] 81 y.o.  Admit date: 10/27/2015 Date of Consult: 10/28/2015  Primary Physician: Eliezer Lofts, MD Primary Cardiologist: Fletcher Anon  Reason for Consultation: tachy/brady - evaluate for pacemaker  HPI:  Monica Hurst is a 81 y.o. female with a past medical history significant for right sided breast cancer, mild dementia, hypertension, diabetes, atrial flutter, symptomatic bradycardia post cardioversion requiring dopamine for a short period of time, and COPD.  She underwent cardivoersion for atrial flutter in 09/2014 and had bradycardia and hypotension afterwards.  At that time, her amiodarone and metoprolol were discontinued. She has since been seen in follow up and has remained bradycardic but relatively asymptomatic with rates in the 50's and incomplete RBBB.  She presented to the ER for evaluation of chest pain on 10-27-15 and was found to be in an atrial tachycardia vs atrial flutter, rate 96, RBBB, LAFB. Rhythm then degenerated into atrial fibrillation with RVR, rates 120's with a 8.6 second post termination pause associated with syncope. This morning, she reports that she feels well. Her chest pain has resolved. She denies shortness of breath, LE edema, recent fevers, chills, nausea or vomiting.   She lives alone and is able to do ADL's without assistance. Her niece helps cook and clean for her as well as make medical decisions.   Echo is pending this admission.   Past Medical History  Diagnosis Date  . Breast cancer ([place])     right-sided. s/p lumpectomy in 7/09. that was followed by mammoSite adjuvant radiation treatment.   . Mild memory disturbances not amounting to dementia   . HTN (hypertension)   . Diabetes type 2, controlled ([place])     if controlled not specified  . Obesity   . Hx of cholecystectomy   . Fibroid uterus     w/postmenopausal vaginal bleeding  .  MENORRHAGIA, POSTMENOPAUSAL 02/06/2010    Qualifier: Diagnosis of  By: Diona Browner MD, Amy    . COPD (chronic obstructive pulmonary disease) ([place])   . Atrial flutter ([place])     a. new onset 08/2014; b. s/p successful DCCV 09/26/14 NSR briefly -->junctional rhythm into the 30s reuiring neo, bagging, & dopamine, HR improved with amio washout   . Chronic systolic CHF (congestive heart failure) ([place]) 2/10    a. echo 01/2009: mod LVH, EF 0000000, mild diastolic dysfunction, normal RV size/fxn, ? RA mass; b. echo 09/01/2014: EF 40-45%, severely dilated LA/RA, trival  global pericardial effusion, mod-severe MR, mod-severe TR,   . Kidney failure      Surgical History:  Past Surgical History  Procedure Laterality Date  . Abnormal myoview  12/09     lexiscan myoview showed EF 70%. no wall motion abnormalities. there was an inferior defect that was likely artifact. possibly some anterior ischemia  . Breast lumpectomy  7/09  . Vaginal hysterectomy  2010    for post menopausal bleeding     Prescriptions prior to admission  Medication Sig Dispense Refill Last Dose  . apixaban (ELIQUIS) 2.5 MG TABS tablet Take 1 tablet (2.5 mg total) by mouth 2 (two) times daily. 28 tablet 0 10/27/2015 at 0830  . carvedilol (COREG) 3.125 MG tablet Take 1 tablet (3.125 mg total) by mouth 2 (two) times daily. 60 tablet 3 10/27/2015 at 0830  . donepezil (ARICEPT) 5 MG tablet Take 1 tablet (5 mg total) by mouth at bedtime. 30 tablet 11 10/26/2015 at Unknown time  . ferrous sulfate  325 (65 FE) MG tablet Take 1 tablet (325 mg total) by mouth daily with breakfast. 30 tablet 6 10/27/2015 at Unknown time  . furosemide (LASIX) 20 MG tablet Take 2 tablets by mouth daily as needed for fluid or edema (For weight Gain >3 pounds) (Patient taking differently: Take 20 mg by mouth daily. Take 1 tablet by mouth daily as needed for fluid or edema (For weight Gain >3 pounds)) 90 tablet 3 10/27/2015 at Unknown time  . lisinopril (PRINIVIL,ZESTRIL) 40 MG  tablet Take 1 tablet (40 mg total) by mouth daily. 40 tablet 5 10/27/2015 at Unknown time  . pravastatin (PRAVACHOL) 20 MG tablet Take 1 tablet (20 mg total) by mouth daily. 90 tablet 3 10/27/2015 at Unknown time  . BAYER MICROLET LANCETS lancets Use to check fasting blood sugar daily.  Dx: E11.8 100 each 3 Taking  . glucose blood (BAYER CONTOUR NEXT TEST) test strip Use to check fasting blood sugar daily.  Dx: E11.8 100 each 3 Taking    Inpatient Medications:  . donepezil  5 mg Oral QHS  . ferrous sulfate  325 mg Oral Q breakfast  . insulin aspart  0-9 Units Subcutaneous TID WC  . lisinopril  40 mg Oral Daily  . pantoprazole  40 mg Oral Q1200  . pravastatin  20 mg Oral q1800    Allergies: No Known Allergies  Social History   Social History  . Marital Status: Widowed    Spouse Name: N/A  . Number of Children: N/A  . Years of Education: N/A   Occupational History  . Not on file.   Social History Main Topics  . Smoking status: Never Smoker   . Smokeless tobacco: Never Used     Comment: does not smoke  . Alcohol Use: No  . Drug Use: No  . Sexual Activity: Not on file   Other Topics Concern  . Not on file   Social History Narrative   Pt lives in [place] with her sister. She has no children but does have nieces and nephews.      Family History  Problem Relation Age of Onset  . Coronary artery disease Neg Hx     no premature CAD  . Hypertension Mother   . Hypertension Father   . Cancer Sister     breast     Review of Systems: All other systems reviewed and are otherwise negative except as noted above.  Physical Exam: Filed Vitals:   10/28/15 0500 10/28/15 0600 10/28/15 0714 10/28/15 0830  BP: 132/68 154/74  157/74  Pulse: 50 49    Temp:   97.8 F (36.6 C)   TempSrc:   Oral   Resp: 16     Height:      Weight:      SpO2: 97% 97%      GEN- The patient is eldery appearing, alert and oriented x 3 today.   HEENT: normocephalic, atraumatic; sclera clear,  conjunctiva pink; hearing intact; oropharynx clear; neck supple  Lungs- Clear to ausculation bilaterally, normal work of breathing.  No wheezes, rales, rhonchi Heart- Bradycardic regular rate and rhythm, no murmurs, rubs or gallops  GI- soft, non-tender, non-distended, bowel sounds present  Extremities- no clubbing, cyanosis, or edema  MS- no significant deformity or atrophy Skin- warm and dry, no rash or lesion Psych- euthymic mood, full affect Neuro- strength and sensation are intact  Labs:   Lab Results  Component Value Date   WBC 4.7 10/28/2015   HGB 12.1  10/28/2015   HCT 38.8 10/28/2015   MCV 93.7 10/28/2015   PLT 122* 10/28/2015    Recent Labs Lab 10/28/15 0636  NA 144  K 4.0  CL 104  CO2 31  BUN 23*  CREATININE 1.64*  CALCIUM 9.2  GLUCOSE 102*      Radiology/Studies: Dg Chest Portable 1 View 10/27/2015  CLINICAL DATA:  Chest pain EXAM: PORTABLE CHEST 1 VIEW COMPARISON:  10/14/2014 FINDINGS: Mild cardiomegaly. Lungs are clear. No pneumothorax. No pleural effusion. IMPRESSION: No active cardiopulmonary disease. Electronically Signed   By: Marybelle Killings M.D.   On: 10/27/2015 14:37    [id number] brady, rate 40, RBBB, LAFB  TELEMETRY: atrial fibrillation with RVR ->> 8.6 second post termination pause -->> sinus brady in the 30's - 40's  Assessment/Plan: 1.  Tachy/brady syndrome The patient has tachy-brady syndrome with significant post termination pauses and symptomatic atrial arrhythmias. Bradycardia limits medication options.  She has significant underlying conduction system disease with RBBB, LAFB.  Pacemaker implantation is recommended at this time. The patient would like to think about pacemaker and discuss with niece (coming after 10AM) - will leave NPO for now.   2.  Persistent atrial fibrillation/atrial flutter Continue Eliquis for CHADS2VASC of at least 5 - dose adjusted for age/renal function Up-titrate medications once pacemaker implanted  3.  Elevated  troponin Likely due to demand ischemia from tachycardia - trend flat Myoview on hold with conduction system disease Further plans per Dr Tamala Julian Echo pending  Dr Lovena Le to see later today.    Signed, Chanetta Marshall, NP 10/28/2015 8:54 AM  EP Attending  Patient seen and examined. I have reviewed the findings as noted by Chanetta Marshall, NP. She has symptomatic sinus node dysfunction and tachybrady syndrome. She had a symptomatic post termination pause. Her exam reveals a regular bradycardia with clear lungs and no peripheral edema. She has a little confusion and appears to be in the early stages of dementia. I have recommended PPM with the patient. We are waiting for her niece to come in today. Will plan PPM if we can obtain informed consent.   Mikle Bosworth.D.

## 2015-10-28 NOTE — Clinical Documentation Improvement (Signed)
Cardiology  Can the diagnosis of Atrial Flutter be further specified? Thank you    Atypical (Type II)  Typical (Type I)  Other  Clinically Undetermined  Document any associated diagnoses/conditions.   Supporting Information: Continue on home medications   Please exercise your independent, professional judgment when responding. A specific answer is not anticipated or expected.   Thank You,  Jones [phone]

## 2015-10-28 NOTE — Progress Notes (Addendum)
Patient Name: Monica Hurst Date of Encounter: 10/28/2015    SUBJECTIVE: Patient is awake and says she feels fine this morning. She doesn't recall fainting last evening but does remember the commotion in her room. She has no chest discomfort currently.  TELEMETRY:  Rhythm strips were reviewed and a very long pause occurred upon conversion from an atrial arrhythmia to sinus bradycardia. This was a genesis of the patient's syncope last evening. Filed Vitals:   10/28/15 0400 10/28/15 0500 10/28/15 0600 10/28/15 0714  BP: 138/71 132/68 154/74   Pulse: 49 50 49   Temp: 98.6 F (37 C)   97.8 F (36.6 C)  TempSrc: Oral   Oral  Resp: 16 16    Height:      Weight: 72 kg (158 lb 11.7 oz)     SpO2: 97% 97% 97%     Intake/Output Summary (Last 24 hours) at 10/28/15 0827 Last data filed at 10/27/15 2300  Gross per 24 hour  Intake      0 ml  Output      0 ml  Net      0 ml   LABS: Basic Metabolic Panel:  Recent Labs  10/27/15 1445 10/27/15 1945 10/28/15 0636  NA 142  --  144  K 3.6  --  4.0  CL 105  --  104  CO2 28  --  31  GLUCOSE 116*  --  102*  BUN 15  --  23*  CREATININE 1.42*  --  1.64*  CALCIUM 9.4  --  9.2  MG  --  2.6*  --    CBC:  Recent Labs  10/27/15 1445 10/28/15 0636  WBC 3.5* 4.7  NEUTROABS 2.4  --   HGB 13.6 12.1  HCT 44.4 38.8  MCV 93.9 93.7  PLT PLATELET CLUMPS NOTED ON SMEAR, COUNT APPEARS DECREASED 122*   Cardiac Enzymes:  Recent Labs  10/27/15 1945 10/28/15 0039 10/28/15 0636  TROPONINI 0.05* 0.06* 0.06*   BNP: Invalid input(s): POCBNP Hemoglobin A1C:  Recent Labs  10/27/15 1945  HGBA1C 6.0*   Fasting Lipid Panel:  Recent Labs  10/28/15 0636  CHOL 202*  HDL 57  LDLCALC 128*  TRIG 86  CHOLHDL 3.5    Radiology/Studies:  No cardiopulmonary abnormalities are noted  Physical Exam: Blood pressure 154/74, pulse 49, temperature 97.8 F (36.6 C), temperature source Oral, resp. rate 16, height 5\' 5"  (1.651  m), weight 72 kg (158 lb 11.7 oz), SpO2 97 %. Weight change:   Wt Readings from Last 3 Encounters:  10/28/15 72 kg (158 lb 11.7 oz)  05/22/15 75.524 kg (166 lb 8 oz)  04/28/15 71.555 kg (157 lb 12 oz)    Lying completely flat in bed Chest is clear anteriorly Systolic murmur is heard along the left midsternal border Extremities reveal no edema  ASSESSMENT:  1. Tachycardia-bradycardia syndrome in setting of trifascicular conduction abnormality (first degree AV block, right bundle branch block, left anterior hemiblock). 2. Elevated troponin without a peak and off, suggest the possibility of demand ischemia in setting of arrhythmia. Cannot totally occluded the possibility of arrhythmia/conduction abnormality related to ischemia. Had planned to perform a pharmacologic myocardial perfusion study however this would be dangerous given her conduction system disease. She is unable to walk on a treadmill. 3. History of atrial flutter with prior cardioversion, 09/26/2014. Profound bradycardia after cardioversion and amiodarone therapy was discontinued.  Plan:  1. We need 2-D Doppler echocardiogram. 2. Have spoken  with electrophysiologist, Dr. Cristopher Peru about the patient and the probable need for DDD pacemaker. 3. This plan was discussed with the patient and she has not given a definite approval but is considering the recommendations that I have made. 4. Nothing by mouth for now. 5. Cancel pharmacologic myocardial perfusion study  Demetrios Isaacs 10/28/2015, 8:27 AM

## 2015-10-28 NOTE — Progress Notes (Signed)
  Echocardiogram 2D Echocardiogram has been performed.  Monica Hurst 10/28/2015, 3:04 PM

## 2015-10-28 NOTE — Progress Notes (Signed)
Niece called for update. Informed RN she will be unable to come to hospital today due to another family emergency. Will come tomorrow.

## 2015-10-29 LAB — CBC
HCT: 39 % (ref 36.0–46.0)
Hemoglobin: 12.2 g/dL (ref 12.0–15.0)
MCH: 29.3 pg (ref 26.0–34.0)
MCHC: 31.3 g/dL (ref 30.0–36.0)
MCV: 93.8 fL (ref 78.0–100.0)
RBC: 4.16 MIL/uL (ref 3.87–5.11)
RDW: 13.3 % (ref 11.5–15.5)
WBC: 4.8 10*3/uL (ref 4.0–10.5)

## 2015-10-29 LAB — BASIC METABOLIC PANEL
ANION GAP: 8 (ref 5–15)
BUN: 18 mg/dL (ref 6–20)
CALCIUM: 9.1 mg/dL (ref 8.9–10.3)
CO2: 32 mmol/L (ref 22–32)
CREATININE: 1.53 mg/dL — AB (ref 0.44–1.00)
Chloride: 102 mmol/L (ref 101–111)
GFR calc Af Amer: 34 mL/min — ABNORMAL LOW (ref >60)
GFR, EST NON AFRICAN AMERICAN: 29 mL/min — AB (ref >60)
GLUCOSE: 100 mg/dL — AB (ref 65–99)
Potassium: 3.7 mmol/L (ref 3.5–5.1)
Sodium: 142 mmol/L (ref 135–145)

## 2015-10-29 LAB — GLUCOSE, CAPILLARY
GLUCOSE-CAPILLARY: 71 mg/dL (ref 65–99)
GLUCOSE-CAPILLARY: 130 mg/dL — AB (ref 65–99)
Glucose-Capillary: 88 mg/dL (ref 65–99)

## 2015-10-29 MED ORDER — SODIUM CHLORIDE 0.9 % IV SOLN: INTRAVENOUS | Status: DC

## 2015-10-29 MED ORDER — GENTAMICIN SULFATE 40 MG/ML IJ SOLN: 80.0000 mg | INTRAMUSCULAR | Status: DC

## 2015-10-29 MED ORDER — CHLORHEXIDINE GLUCONATE 4 % EX LIQD: 60.0000 mL | Freq: Once | CUTANEOUS | Status: DC

## 2015-10-29 MED ORDER — PANTOPRAZOLE SODIUM 40 MG PO TBEC: 40.0000 mg | DELAYED_RELEASE_TABLET | Freq: Every day | ORAL | Status: DC

## 2015-10-29 MED ORDER — AMLODIPINE BESYLATE 5 MG PO TABS: 5.0000 mg | ORAL_TABLET | Freq: Every day | ORAL | Status: DC

## 2015-10-29 MED ORDER — CEFAZOLIN SODIUM-DEXTROSE 2-3 GM-% IV SOLR: 2.0000 g | INTRAVENOUS | Status: DC

## 2015-10-29 NOTE — Progress Notes (Signed)
SUBJECTIVE: The patient is doing well today.  At this time, she denies chest pain, shortness of breath, or any new concerns. She had presented with chest pain and atrial tach which was interrupted by a >8 sec post termination event.  She has hx of recurrent atrial arrhtyhmia   . amLODipine  5 mg Oral Daily  . donepezil  5 mg Oral QHS  . ferrous sulfate  325 mg Oral Q breakfast  . insulin aspart  0-9 Units Subcutaneous TID WC  . lisinopril  40 mg Oral Daily  . pantoprazole  40 mg Oral Q1200  . pravastatin  20 mg Oral q1800      OBJECTIVE: Physical Exam: Filed Vitals:   10/29/15 0700 10/29/15 0800 10/29/15 0900 10/29/15 1021  BP: 174/76 166/64  174/78  Pulse: 45 42 43   Temp: 97.5 F (36.4 C)     TempSrc: Oral     Resp: 20 12 19    Height:      Weight:      SpO2: 99% 100% 97%     Intake/Output Summary (Last 24 hours) at 10/29/15 1049 Last data filed at 10/29/15 0800  Gross per 24 hour  Intake    510 ml  Output      0 ml  Net    510 ml    Telemetry reveals sinus rhythm, sinus bradycardia  GEN- The patient is, elderly, well appearing, alert and oriented x 3 today.   Head- normocephalic, atraumatic Eyes-  Sclera clear, conjunctiva pink Ears- hearing intact Oropharynx- clear Neck- supple, no JVP Lungs- Clear to ausculation bilaterally, normal work of breathing Heart- Regular rate and rhythm, no significant murmurs, no rubs or gallops GI- soft, NT, ND, + BS Extremities- no clubbing, cyanosis, or edema Skin- no rash or lesion Psych- euthymic mood, full affect Neuro- no gross deficits appreciated  LABS: Basic Metabolic Panel:  Recent Labs  10/27/15 1945 10/28/15 0636 10/29/15 0318  NA  --  144 142  K  --  4.0 3.7  CL  --  104 102  CO2  --  31 32  GLUCOSE  --  102* 100*  BUN  --  23* 18  CREATININE  --  1.64* 1.53*  CALCIUM  --  9.2 9.1  MG 2.6*  --   --    CBC:  Recent Labs  10/27/15 1445 10/28/15 0636 10/29/15 0318  WBC 3.5* 4.7 4.8    NEUTROABS 2.4  --   --   HGB 13.6 12.1 12.2  HCT 44.4 38.8 39.0  MCV 93.9 93.7 93.8  PLT PLATELET CLUMPS NOTED ON SMEAR, COUNT APPEARS DECREASED 122* PLATELET CLUMPS NOTED ON SMEAR   Cardiac Enzymes:  Recent Labs  10/27/15 1945 10/28/15 0039 10/28/15 0636  TROPONINI 0.05* 0.06* 0.06*   Hemoglobin A1C:  Recent Labs  10/27/15 1945  HGBA1C 6.0*   Fasting Lipid Panel:  Recent Labs  10/28/15 0636  CHOL 202*  HDL 57  LDLCALC 128*  TRIG 86  CHOLHDL 3.5   Thyroid Function Tests:  Recent Labs  10/27/15 1945  TSH 1.657   10/28/15: Echocardiogram Study Conclusions - Left ventricle: The cavity size was normal. Wall thickness was increased in a pattern of moderate LVH. There was focal basal hypertrophy. Systolic function was mildly reduced. The estimated ejection fraction was in the range of 45% to 50%. - Mitral valve: There was moderate to severe regurgitation. - Left atrium: The atrium was severely dilated. - Right ventricle: The cavity size  was normal. Wall thickness was mildly to moderately increased. Systolic function was mildly reduced. - Right atrium: The atrium was mildly to moderately dilated. - Pulmonary arteries: Systolic pressure was mildly increased. PA peak pressure: 32 mm Hg (S).  RADIOLOGY: Dg Chest Portable 1 View 10/27/2015  CLINICAL DATA:  Chest pain EXAM: PORTABLE CHEST 1 VIEW COMPARISON:  10/14/2014 FINDINGS: Mild cardiomegaly. Lungs are clear. No pneumothorax. No pleural effusion. IMPRESSION: No active cardiopulmonary disease. Electronically Signed   By: Marybelle Killings M.D.   On: 10/27/2015 14:37    ASSESSMENT AND PLAN:  Principal Problem:   Chest pain Active Problems:   Paroxysmal atrial fibrillation ([facility])   Chronic renal disease, stage 3, moderately decreased glomerular filtration rate between 30-59 mL/min/1.73 square meter   Anemia of chronic disease   COPD (chronic obstructive pulmonary disease) ([facility])   Diabetes mellitus  with nephropathy ([facility])   HLD (hyperlipidemia)   Benign essential HTN   Trifascicular block   Elevated troponin   Tachycardia-bradycardia syndrome ([facility])   Sinus node dysfunction ([facility])   Tachy-brady syndrome ([place])  1. Tachy/brady syndrome The patient has tachy-brady syndrome with significant post termination pauses and symptomatic atrial arrhythmias. Bradycardia limits medication options. She has significant underlying conduction system disease with RBBB, LAFB. Pacemaker implantation is recommended at this time.  After lengthy discussion with Dr. Caryl Comes, the patient is agreeable to proceed   2. Persistent atrial fibrillation/atrial flutter Continue Eliquis for CHADS2VASC of at least 5 - dose adjusted for age/renal function Resume post PPM  3. Elevated troponin Likely due to demand ischemia from tachycardia - trend flat Myoview on hold with conduction system disease Further plans per Dr Smith/cardiology team  4. HTN Adjust medicines post PPM  Tommye Standard, PA-C 10/29/2015 10:49 AM  As above L;engthy discussion and full examination  Post termination pauses and sinus bradycardia both beg intervention and pacing is the only solution   Her renal function and age make dofetilide an untenable option  Once she is paced BB or CCB can be used for BP and rate control  The benefits and risks were reviewed including but not limited to death,  perforation, infection, lead dislodgement and device malfunction.  The patient understands agrees and is willing to proceed.

## 2015-10-29 NOTE — Progress Notes (Signed)
At shift assessment one of pt's PIVs had come out while the other one was found to be infiltrated, so was removed. Placed an IV team consult with priority listed "ASAP" due to pt's pacemaker implantation procedure scheduled for 1030 with Dr. Caryl Comes. First IV team nurse could not re-establish access. Second IV team nurse was able to place one IV in the right antecubital.  Henreitta Leber, RN 12:26 PM 10/29/2015

## 2015-10-29 NOTE — Discharge Instructions (Signed)
Do not take Eliquis night of 11/03/15. You will need to hold dose for pacemaker implantation on 11/04/15.

## 2015-10-29 NOTE — Discharge Summary (Signed)
Physician Discharge Summary  Patient ID: Monica Hurst MRN: [id number] DOB/AGE: [birth year] 81 y.o.   Primary Cardiologist: Dr. Fletcher Anon Electrophysiologist: Dr. Caryl Comes  Admit date: 10/27/2015 Discharge date: 10/29/2015  Admission Diagnoses: Chest Pain  Discharge Diagnoses:  Principal Problem:   Chest pain Active Problems:   Paroxysmal atrial fibrillation (H[facility])   Chronic renal disease, stage 3, moderately decreased glomerular filtration rate between 30-59 mL/min/1.73 square meter   Anemia of chronic disease   COPD (chronic obstructive pulmonary disease) ([facility])   Diabetes mellitus with nephropathy ([facility])   HLD (hyperlipidemia)   Benign essential HTN   Trifascicular block   Elevated troponin   Tachycardia-bradycardia syndrome ([facility])   Sinus node dysfunction ([facility])   Tachy-brady syndrome ([place])   Discharged Condition: stable  Hospital Course: Monica Hurst is a 81 y.o. female with a past medical history significant for right sided breast cancer, mild dementia, hypertension, diabetes, atrial flutter, symptomatic bradycardia post cardioversion requiring dopamine for a short period of time, and COPD. She underwent cardivoersion for atrial flutter in 09/2014 and had bradycardia and hypotension afterwards. At that time, her amiodarone and metoprolol were discontinued. She has since been seen in follow up and has remained bradycardic but relatively asymptomatic with rates in the 50's and incomplete RBBB. She presented to the ER for evaluation of chest pain on 10-27-15 and was found to be in an atrial tachycardia vs atrial flutter, rate 96, RBBB, LAFB. Rhythm then degenerated into atrial fibrillation with RVR, rates 120's with a 8.6 second post termination pause associated with syncope.  Troponins were cycled and showed low level flat rend. This was felt be be likely secondary to demand ischemia from her tachycardia. 2D echo showed mildly reduced LV systolic function with an  estimated EF of 45-50%. Moderate to severe MR was noted. She was seen by EP who recommended PPM implantation. However, the patient decided that she wanted to delay the procedure until after the Thanksgiving holiday. She has been scheduled for PPM insertion on 11/04/15 with Dr. Caryl Comes. She has been instructed to hold her Eliquis the night prior to procedure. She was also instructed to discontinue Coreg. She was last seen and examined by Dr. Caryl Comes who determined she was stable for discharge home.    Significant Diagnostic Studies/Treatments: See Hospital Course  Discharge Exam: Blood pressure 184/71, pulse 54, temperature 98.1 F (36.7 C), temperature source Oral, resp. rate 18, height 5\' 5"  (1.651 m), weight 147 lb 4.3 oz (66.8 kg), SpO2 96 %.   Disposition:       Discharge Instructions    Diet - low sodium heart healthy    Complete by:  As directed      Increase activity slowly    Complete by:  As directed      Do not take Eliquis night of 11/03/15. You will need to hold dose for pacemaker implantation on 11/04/15.        Medication List    STOP taking these medications        carvedilol 3.125 MG tablet  Commonly known as:  COREG      TAKE these medications        amLODipine 5 MG tablet  Commonly known as:  NORVASC  Take 1 tablet (5 mg total) by mouth daily.     apixaban 2.5 MG Tabs tablet  Commonly known as:  ELIQUIS  Take 1 tablet (2.5 mg total) by mouth 2 (two) times daily.     BAYER MICROLET LANCETS lancets  Use  to check fasting blood sugar daily.  Dx: E11.8     donepezil 5 MG tablet  Commonly known as:  ARICEPT  Take 1 tablet (5 mg total) by mouth at bedtime.     ferrous sulfate 325 (65 FE) MG tablet  Take 1 tablet (325 mg total) by mouth daily with breakfast.     furosemide 20 MG tablet  Commonly known as:  LASIX  Take 2 tablets by mouth daily as needed for fluid or edema (For weight Gain >3 pounds)     glucose blood test strip  Commonly known as:  BAYER  CONTOUR NEXT TEST  Use to check fasting blood sugar daily.  Dx: E11.8     lisinopril 40 MG tablet  Commonly known as:  PRINIVIL,ZESTRIL  Take 1 tablet (40 mg total) by mouth daily.     pantoprazole 40 MG tablet  Commonly known as:  PROTONIX  Take 1 tablet (40 mg total) by mouth daily at 12 noon.     pravastatin 20 MG tablet  Commonly known as:  PRAVACHOL  Take 1 tablet (20 mg total) by mouth daily.       TIME SPENT ON DISCHARGE, INCLUDING PHYSICIAN TIME: >30 MINUTES  Signed: Lyda Jester 10/29/2015, 2:36 PM

## 2015-10-29 NOTE — Progress Notes (Signed)
Dr. Caryl Comes came to pt's room at 1330 and discussed her wishes and determine a plan of care moving forward. Pt stated she did not want the pacemaker placed today, that she wanted to go home today and check on her house and would be okay scheduling the procedure for next week. Dr. Caryl Comes went over the risks of leaving without a pacemaker, but pt adamant that she wants to go home today. Dr. Caryl Comes said he would have the procedure scheduled for next week and would have the Cath lab notify me about the details. Pt verbalized understanding and agreement.   Cath lab called at 1345 with the details. Plan is for pt to get pacemaker placed on Tuesday November 04, 2015 at 1:30pm, and for pt to arrive to Short Stay at 11:30am.   Currently waiting for discharge orders to be placed.  Henreitta Leber, RN 1:51 PM 10/29/2015

## 2015-10-29 NOTE — Progress Notes (Signed)
Talked in person to [place], [place] about putting in discharge orders for pt at 1355. She said she would put those orders in as soon as she could.  Henreitta Leber, RN 1:57 PM 10/29/2015

## 2015-10-29 NOTE — Progress Notes (Addendum)
Dr. Caryl Comes came by this morning and discussed the procedure and the risks and benefits with the pt. Pt verbalized understanding, agreed to have the pacemaker placed, and signed the consent form with RN Marta Lamas. Went into the room at noon to start preoperative preparations and pt verbalized she had decided not to have the procedure today and wants to go home. At first she said she didn't have a specific reason to refuse the procedure, but a few minutes later said she thought she'd left her stove on at home and that she was afraid her neighbors may rob her if she didn't go home today. Myself and Festus Holts reiterated the risks of not getting this pacemaker placed and explained that if she wanted to go home today it would likely be AMA.  I called her niece Monica Hurst and explained the situation to her. She talked with the pt over the phone, and when they finished talking the pt said she was okay proceeding with the pacemaker placement today. Not five minutes later she said she does not want to do the procedure today and wants to go home, again citing her fear about leaving the stove on. Monica Hurst said she couldn't come to the hospital until later today. I called the other niece, Monica Hurst, but was unable to reach her at home or via cell phone. I attempted to reach the third contact listed, Monica Hurst (relation to pt not known), but the number was not in service.   Pt has a history of memory impairment d/t dementia.   I called the cath lab at 1245 to notify them that the pt was refusing the procedure. The Cath lab RN said she would call Dr. Caryl Comes and ask him if he wanted to talk with the pt again.  Monica Leber, RN 12:52 PM 10/29/2015

## 2015-10-29 NOTE — Progress Notes (Signed)
Pt declines pacing  She needs to get home She is aware of my returning from discussions about 2 hrs before, and the content of the conversation as it relates to pacing for pausing She is concerned about home things She is aware of risk of falling and the recommendations NOT TO drive We have scheduled her for pacer implant with [facility] on tues afternoon for tachybrady and post termination pauses

## 2015-10-29 NOTE — Progress Notes (Signed)
Around 1650, pt removed all monitoring equipment and refused vital sign monitoring. Has discharge orders, but all of the pt's family I've called refuse to give a definite time when they will come pick her up. Charge nurse Arliss Journey calling niece now [phone]) to see when she's coming. Reviewed discharge papers with pt and gave her a copy.   Henreitta Leber, RN 5:59 PM 10/29/2015

## 2015-11-04 ENCOUNTER — Telehealth: Payer: Self-pay

## 2015-11-04 ENCOUNTER — Telehealth: Payer: Self-pay | Admitting: Physician Assistant

## 2015-11-04 ENCOUNTER — Encounter (HOSPITAL_COMMUNITY): Admission: RE | Payer: Self-pay | Source: Ambulatory Visit

## 2015-11-04 ENCOUNTER — Ambulatory Visit (HOSPITAL_COMMUNITY): Admission: RE | Admit: 2015-11-04 | Payer: Medicare Other | Source: Ambulatory Visit | Admitting: Internal Medicine

## 2015-11-04 SURGERY — PACEMAKER IMPLANT

## 2015-11-04 NOTE — Telephone Encounter (Addendum)
Dr. Curt Bears is on vacation next week and not in hospital for procedure.  Patient changed to Dr. Tanna Furry schedule that day.  No change in procedure time. Chanetta Marshall, NP made aware.

## 2015-11-04 NOTE — Telephone Encounter (Signed)
Spoke with Shiron, neice. Did not share any information as previously requested by patient.  Shiron concerned about her dementia, family does not know what to do.  I agree with no driving and need for pacemaker as recommended by cards.  Family has tried to come to gether to help her.. She consistently refuses and gets angry.  She called DSS, but they felt she was not a harm to herself.   Wants me to consider hospice referral since she refuses pacemaker. Pt current does not have a phone. Not able clean or cook for herself. Very tired, wobbly.  Butch Penny, please call pt to have her make a follow up appointment for recent hospitalization ASAP. Call Wendnesday AM when they have new phone set up.

## 2015-11-04 NOTE — Telephone Encounter (Signed)
Nicole Kindred (DPR signed) left v/m requesting cb about pt. Left v/m requesting cb.

## 2015-11-04 NOTE — Telephone Encounter (Signed)
Spoke with patient's niece - the patient is still unsure about proceeding with pacemaker implant. We will reschedule to Monday, December 5th at 1PM with Dr Curt Bears (neice off that day).  Niece will call back by Friday if patient would like to cancel procedure. Instructions reviewed.  Chanetta Marshall, NP 11/04/2015 8:33 AM

## 2015-11-04 NOTE — Telephone Encounter (Signed)
Shiron called back; pt needs to have pacemaker placed; placement of pacemaker has been scheduled x 2 and pt has cancelled both times. Shiron is also concerned about pt continuing to drive due to safety of pt as well as others safety. shiron not sure how to proceed; request cb.

## 2015-11-05 NOTE — Telephone Encounter (Signed)
Left message for Monica Hurst to return my call.

## 2015-11-06 NOTE — Telephone Encounter (Signed)
Spoke with Ms. Rozsa niece.  She will have Ms. Heuton call Friday or Monday to schedule office visit with Dr. Diona Browner.

## 2015-11-09 MED ORDER — GENTAMICIN SULFATE 40 MG/ML IJ SOLN: 80.0000 mg | INTRAMUSCULAR | Status: DC

## 2015-11-09 MED ORDER — CEFAZOLIN SODIUM-DEXTROSE 2-3 GM-% IV SOLR: 2.0000 g | INTRAVENOUS | Status: DC

## 2015-11-10 ENCOUNTER — Encounter (HOSPITAL_COMMUNITY): Admission: RE | Payer: Self-pay | Source: Ambulatory Visit

## 2015-11-10 ENCOUNTER — Ambulatory Visit (HOSPITAL_COMMUNITY): Admission: RE | Admit: 2015-11-10 | Payer: Medicare Other | Source: Ambulatory Visit

## 2015-11-10 ENCOUNTER — Telehealth: Payer: Self-pay | Admitting: *Deleted

## 2015-11-10 ENCOUNTER — Telehealth: Payer: Self-pay | Admitting: Cardiovascular Disease

## 2015-11-10 SURGERY — PACEMAKER IMPLANT

## 2015-11-10 NOTE — Telephone Encounter (Signed)
Left message on machine for patient to contact the office.   

## 2015-11-10 NOTE — Telephone Encounter (Signed)
nEW message    Pt calling to speak to rn about the follow up with Dr. Caryl Comes

## 2015-11-10 NOTE — Telephone Encounter (Signed)
Pt niece calling stating patient decided this morning that she does not want to do procedure at 1pm today Please advise.

## 2015-11-10 NOTE — Telephone Encounter (Signed)
S/w pt niece who states pt has refused pacemaker implant today because she "has some business to take care of". Pt has cancelled before. Niece would also like to be removed as contact person.   Notified Crystal in [place], [phone], of cancellation. She states she will inform Dr. Lovena Le

## 2015-11-10 NOTE — Telephone Encounter (Signed)
I spoke with the pt and she said that she could have come for procedure today but her niece is out of town and she was handling everything.    The pt is very confused on the phone and said that she does not feel well and she is at home all by herself.  I advised her that she has cancelled pacemaker placement 2 times and we will have to speak with MD in regards to rescheduling procedure (EP Caryl Comes).  I advised the pt that if she is not feeling well then she can proceed to the [facility] ER for further evaluation.   Per documentation from earlier phone note today the niece no longer wants to be a contact for this pt.  Georgiana Shore, RN at 11/10/2015 9:24 AM     Status: Signed       Expand All Collapse All   S/w pt niece who states pt has refused pacemaker implant today because she "has some business to take care of". Pt has cancelled before. Niece would also like to be removed as contact person.   Notified Crystal in [place], [phone], of cancellation. She states she will inform Dr. Lovena Le

## 2015-11-10 NOTE — Telephone Encounter (Signed)
Appointment scheduled for 11/11/2015 at 10:45 am with Dr. Diona Browner.

## 2015-11-11 ENCOUNTER — Telehealth: Payer: Self-pay | Admitting: Family Medicine

## 2015-11-11 ENCOUNTER — Ambulatory Visit: Payer: Medicare Other | Admitting: Family Medicine

## 2015-11-11 NOTE — Telephone Encounter (Signed)
Pt did not come in for their appt today for office visit. Please let me know if pt needs to be contacted immediately for follow up or no follow up needed. Best phone number to contact pt is [phone].

## 2015-11-11 NOTE — Telephone Encounter (Signed)
Contact pt ASAP for follow up, let me know if unable to reach her.

## 2015-11-12 ENCOUNTER — Ambulatory Visit: Payer: Medicare Other | Admitting: Family Medicine

## 2015-11-12 ENCOUNTER — Telehealth: Payer: Self-pay | Admitting: Cardiovascular Disease

## 2015-11-12 NOTE — Telephone Encounter (Signed)
Reviewed with Dr. Caryl Comes- try to schedule for PPM when patient can come. 2 attempts made today to reach the patient and speak with her about scheduling her PPM implant. The patient missed her appointment today with Dr. Diona Browner and is rescheduled for tomorrow.  Will forward this message to Dr. Diona Browner as an Juluis Rainier that we are trying to reach the patient to schedule her PPM that has been cancelled x 2. I am uncertain about her mental status at this time and the patient's niece no longer wants [place] to contact her.   Dr. Diona Browner,  I will continue to try to reach the patient. If she is in your office tomorrow and you have any immediate concerns that you need Dr. Caryl Comes to be aware of, he will be in the [place] office tomorrow afternoon (12/8). Please feel free to call the office at [phone] and ask for Dr. Caryl Comes or myself.  Thank you! Alvis Lemmings, RN, BSN

## 2015-11-12 NOTE — Telephone Encounter (Signed)
Please remove her from the schedule so she will not be changed for no show.

## 2015-11-12 NOTE — Telephone Encounter (Signed)
This patient unfortunately has significant dementia but per her niece is not allowing her family to help her set up an phone at her house, transport to appointments, help with meds etc. She has no showed for 2 appointments already this week. Thank you for the update with this difficult situation. I will call if any cardiac issue arises if she comes to her appointment tommorow.  If she comes to clinic on 12/8 or I can reach her by phone, I will encourage her to let her family help her with her healthcare.  If she refuses I plan to contact adult protective services for evaluation given this is an unsafe situation for her at home by herself.

## 2015-11-12 NOTE — Telephone Encounter (Signed)
3 attempts to schedule from recall list.  LMOV to call office.  Deleting recall.   °

## 2015-11-12 NOTE — Telephone Encounter (Signed)
Pt was scheduled today at 10:15am for an appt. She came in at 12:30pm stating her daughter couldn't bring her so she had to find a ride. She is rescheduled to see you tomorrow 11/13/15 at 2:45pm for an office visit. Do you want me to take the one for today off your schedule?

## 2015-11-13 ENCOUNTER — Encounter: Payer: Self-pay | Admitting: *Deleted

## 2015-11-13 ENCOUNTER — Ambulatory Visit (INDEPENDENT_AMBULATORY_CARE_PROVIDER_SITE_OTHER): Payer: Medicare Other | Admitting: Family Medicine

## 2015-11-13 ENCOUNTER — Encounter: Payer: Self-pay | Admitting: Family Medicine

## 2015-11-13 VITALS — BP 140/70 | HR 54 | Temp 97.6°F | Wt 169.0 lb

## 2015-11-13 DIAGNOSIS — I4892 Unspecified atrial flutter: Secondary | ICD-10-CM | POA: Diagnosis not present

## 2015-11-13 DIAGNOSIS — F03B0 Unspecified dementia, moderate, without behavioral disturbance, psychotic disturbance, mood disturbance, and anxiety: Secondary | ICD-10-CM

## 2015-11-13 DIAGNOSIS — I495 Sick sinus syndrome: Secondary | ICD-10-CM

## 2015-11-13 DIAGNOSIS — I10 Essential (primary) hypertension: Secondary | ICD-10-CM

## 2015-11-13 DIAGNOSIS — I5041 Acute combined systolic (congestive) and diastolic (congestive) heart failure: Secondary | ICD-10-CM

## 2015-11-13 DIAGNOSIS — E43 Unspecified severe protein-calorie malnutrition: Secondary | ICD-10-CM

## 2015-11-13 DIAGNOSIS — F0390 Unspecified dementia without behavioral disturbance: Secondary | ICD-10-CM

## 2015-11-13 NOTE — Progress Notes (Signed)
Pre visit review using our clinic review tool, if applicable. No additional management support is needed unless otherwise documented below in the visit note. 

## 2015-11-13 NOTE — Patient Instructions (Addendum)
Check to see if taking donezepil. Please call to let us know.. We can increase to 10 mg if already on 5 mg. Have family or friend bring you to PPM.. Rescheduled for: Stop at front desk to set up home health.

## 2015-11-13 NOTE — Telephone Encounter (Signed)
Dr. Caryl Comes- forwarding to you as an FYI.

## 2015-11-13 NOTE — Progress Notes (Signed)
Subjective:    Patient ID: Monica Hurst, female    DOB: 1924/03/08, 81 y.o.   MRN: [id number]  HPI  81 year old female with a past medical history significant for right sided breast cancer, moderate dementia, hypertension, diabetes, atrial flutter, symptomatic bradycardia post cardioversion requiring dopamine for a short period of time, and COPD presents for hospital follow up. She was admitted from 10/27/2015 to 11/23 for  Chest pain. She underwent cardivoersion for atrial flutter in 09/2014 and had bradycardia and hypotension afterwards. At that time, her amiodarone and metoprolol were discontinued. She has since been seen in follow up and has remained bradycardic but relatively asymptomatic with rates in the 50's and incomplete RBBB. She presented to the ER for evaluation of chest pain on 10-27-15 and was found to be in an atrial tachycardia vs atrial flutter, rate 96, RBBB, LAFB. Rhythm then degenerated into atrial fibrillation with RVR, rates 120's with a 8.6 second post termination pause associated with syncope. Troponins were cycled and showed low level flat rend. This was felt be be likely secondary to demand ischemia from her tachycardia. 2D echo showed mildly reduced LV systolic function with an estimated EF of 45-50%. Moderate to severe MR was noted. She was seen by EP who recommended PPM implantation. However, the patient decided that she wanted to delay the procedure until after the Thanksgiving holiday. She has been scheduled for PPM insertion on 11/04/15 with Dr. Caryl Comes.  She has missed 3 scheduled appointments for PPM placement in last few weeks. She has missed 2 appts for hospital follow up here as well this week.  There is high level of concern from family via phone call for pt self care at home and dementia changes.  Today in the clinic she reports she feels well. No chest pain, no shortness of breath. No swelling in ankles. She denies dizziness.  Friend comes by to  help, bring her food at times.   HR 54, o2 sat 94% BP Readings from Last 3 Encounters:  11/13/15 140/70  10/29/15 104/86  05/22/15 158/82    She is on donezepil, ? If taking  Wt Readings from Last 3 Encounters:  11/13/15 169 lb (76.658 kg)  10/29/15 147 lb 4.3 oz (66.8 kg)  05/22/15 166 lb 8 oz (75.524 kg)          Review of Systems  Constitutional: Negative for fever and fatigue.  HENT: Negative for ear pain.   Eyes: Negative for pain.  Respiratory: Negative for chest tightness and shortness of breath.   Cardiovascular: Negative for chest pain, palpitations and leg swelling.  Gastrointestinal: Negative for abdominal pain.  Genitourinary: Negative for dysuria.       Objective:   Physical Exam  Constitutional: Vital signs are normal. She appears well-developed and well-nourished. She is cooperative.  Non-toxic appearance. She does not appear ill. No distress.  HENT:  Head: Normocephalic.  Right Ear: Hearing, tympanic membrane, external ear and ear canal normal. Tympanic membrane is not erythematous, not retracted and not bulging.  Left Ear: Hearing, tympanic membrane, external ear and ear canal normal. Tympanic membrane is not erythematous, not retracted and not bulging.  Nose: No mucosal edema or rhinorrhea. Right sinus exhibits no maxillary sinus tenderness and no frontal sinus tenderness. Left sinus exhibits no maxillary sinus tenderness and no frontal sinus tenderness.  Mouth/Throat: Uvula is midline, oropharynx is clear and moist and mucous membranes are normal.  Eyes: Conjunctivae, EOM and lids are normal. Pupils are equal, round,  and reactive to light. Lids are everted and swept, no foreign bodies found.  Neck: Trachea normal and normal range of motion. Neck supple. Carotid bruit is not present. No thyroid mass and no thyromegaly present.  Cardiovascular: Normal rate, regular rhythm, S1 normal, S2 normal, normal heart sounds, intact distal pulses and normal pulses.   Exam reveals no gallop and no friction rub.   No murmur heard. minimal peripheral edema  Pulmonary/Chest: Effort normal and breath sounds normal. No tachypnea. No respiratory distress. She has no decreased breath sounds. She has no wheezes. She has no rhonchi. She has no rales.  Abdominal: Soft. Normal appearance and bowel sounds are normal. There is no tenderness.  Neurological: She is alert.  Skin: Skin is warm, dry and intact. No rash noted.  Psychiatric: Her speech is normal and behavior is normal. Thought content normal. Her mood appears not anxious. Cognition and memory are impaired. She expresses inappropriate judgment. She does not exhibit a depressed mood. She exhibits abnormal recent memory and abnormal remote memory.    MMSE exam today 28-20/30. 0 correct answers to orientation       Assessment & Plan:

## 2015-11-13 NOTE — Telephone Encounter (Signed)
Call received from [facility] at Dr. Rometta Emery office today. The patient did come for her appointment today. She is willing to proceed with PPM implant.  This has been scheduled for 11/24/15 at 9:30 am. Instructions letter was done in [facility] today for Brooke to print off and give to the patient. I also requested they draw a BMP/ CBC on her today while she is in the office.

## 2015-11-14 ENCOUNTER — Telehealth: Payer: Self-pay | Admitting: Radiology

## 2015-11-14 ENCOUNTER — Other Ambulatory Visit: Payer: Self-pay | Admitting: Family Medicine

## 2015-11-14 DIAGNOSIS — R7989 Other specified abnormal findings of blood chemistry: Secondary | ICD-10-CM

## 2015-11-14 DIAGNOSIS — D509 Iron deficiency anemia, unspecified: Secondary | ICD-10-CM

## 2015-11-14 LAB — CBC WITH DIFFERENTIAL/PLATELET
BASOS PCT: 0.7 % (ref 0.0–3.0)
Basophils Absolute: 0 10*3/uL (ref 0.0–0.1)
EOS ABS: 0.3 10*3/uL (ref 0.0–0.7)
Eosinophils Relative: 6.6 % — ABNORMAL HIGH (ref 0.0–5.0)
HCT: 38.3 % (ref 36.0–46.0)
Hemoglobin: 12.1 g/dL (ref 12.0–15.0)
Lymphocytes Relative: 27.8 % (ref 12.0–46.0)
Lymphs Abs: 1.3 10*3/uL (ref 0.7–4.0)
MCHC: 31.5 g/dL (ref 30.0–36.0)
MCV: 91.2 fl (ref 78.0–100.0)
MONO ABS: 0.3 10*3/uL (ref 0.1–1.0)
Monocytes Relative: 6.7 % (ref 3.0–12.0)
NEUTROS ABS: 2.8 10*3/uL (ref 1.4–7.7)
Neutrophils Relative %: 58.2 % (ref 43.0–77.0)
PLATELETS: 73 10*3/uL — AB (ref 150.0–400.0)
RBC: 4.2 Mil/uL (ref 3.87–5.11)
RDW: 13.7 % (ref 11.5–15.5)
WBC: 4.8 10*3/uL (ref 4.0–10.5)

## 2015-11-14 LAB — BASIC METABOLIC PANEL
BUN: 21 mg/dL (ref 6–23)
CHLORIDE: 109 meq/L (ref 96–112)
CO2: 29 mEq/L (ref 19–32)
CREATININE: 1.28 mg/dL — AB (ref 0.40–1.20)
Calcium: 9.6 mg/dL (ref 8.4–10.5)
GFR: 50.48 mL/min — AB (ref >60.00)
Glucose, Bld: 87 mg/dL (ref 70–99)
POTASSIUM: 3.9 meq/L (ref 3.5–5.1)
Sodium: 148 mEq/L — ABNORMAL HIGH (ref 135–145)

## 2015-11-14 NOTE — Telephone Encounter (Signed)
[facility] called abnormal  results for a CBC, "platletes are clumping, draw a Na Citrate tube". I have contacted the patient, she will come in on Monday, 12.12.16

## 2015-11-14 NOTE — Assessment & Plan Note (Signed)
Tolerable control at pt age.

## 2015-11-14 NOTE — Assessment & Plan Note (Signed)
Poor control. ? If pt taking aricept. If so increase dose, if not restart.  Pt friend agreed to be emergency call contact.  She states she will call up after reviewing pt meds.  She will also drive her and remind her of pacemaker appt given pt not oriented.  Will set up with home health ( pt states she will let them in) for med management, safety eval etc.

## 2015-11-14 NOTE — Assessment & Plan Note (Signed)
Pt weight has increase, better appetite per pt, eating better. ? If fluid buildup?  Follow.

## 2015-11-14 NOTE — Assessment & Plan Note (Signed)
Pt state she did not understand heart issue and need of pacemaker. Today she has agreed to have PPM placed. Appt set, friend who is with her agrees to take her and was given directions and date/time.

## 2015-11-14 NOTE — Assessment & Plan Note (Addendum)
No significant swelling on exam, pt has gained weihgt since novemeber. Follow for fluid overload.

## 2015-11-17 ENCOUNTER — Other Ambulatory Visit: Payer: Medicare Other

## 2015-11-18 ENCOUNTER — Telehealth: Payer: Self-pay

## 2015-11-18 ENCOUNTER — Telehealth: Payer: Self-pay | Admitting: Cardiovascular Disease

## 2015-11-18 MED ORDER — DONEPEZIL HCL 5 MG PO TABS: 5.0000 mg | ORAL_TABLET | Freq: Every day | ORAL | Status: DC

## 2015-11-18 NOTE — Telephone Encounter (Signed)
Please call h/h nurse and talk about patient medication issues.  She has not been taking meds as rx.  Moved appt up to Friday 12/16.    Vaughan Basta with [employer] [phone]

## 2015-11-18 NOTE — Telephone Encounter (Signed)
Linda nurse with [facility]  Is at pts home reviewing pts med; Vaughan Basta cannot find any bottle with Donepezil on it. Vaughan Basta does not think pt has been taking Donepezil; per 11/13/15 office note Donepezil 5 mg taking one tab po at hs sent to [facility]; Vaughan Basta is aware and will call cardiologist about other meds. FYI to [name].

## 2015-11-18 NOTE — Telephone Encounter (Signed)
Left message for Vaughan Basta with [facility] that a Rx for Aricept has been sent into [facility] in [place] and to have patient start taking it daily as prescribed.

## 2015-11-18 NOTE — Telephone Encounter (Signed)
Sent in rx for Aricept to have pt begin taking.

## 2015-11-19 NOTE — Telephone Encounter (Signed)
S/w Vaughan Basta, RN at [employer] who states pt has not been compliant w/medications for "months".Per Vaughan Basta,  pt lives alone with few family members to assist. She states she visits pt  sometimes 2 days per week and she has noticed pt is not taking medications and "probably couldn't even if she had the bubble packs" as patient forgets. Vaughan Basta states she is most likely going to pursue social services visit w/ultimately having to involve Adult Protective Services Yesterday she reports pt pulse "all over the map", asymptomatic. Vaughan Basta is concerned pt will have a heart attack or stroke due to non-compliance w/meds.  She requested an earlier appt w/Dr. Fletcher Anon to determine medication list.. Appt moved to 12/15 [facility] informed me that [employer], family member, will be transporting pt. I gave Vaughan Basta directions to office. She states she will call [facility] w/information. Vaughan Basta is appreciative of the call as she states she is concerned for pt well-being.

## 2015-11-21 ENCOUNTER — Ambulatory Visit (INDEPENDENT_AMBULATORY_CARE_PROVIDER_SITE_OTHER): Payer: Medicare Other | Admitting: Cardiovascular Disease

## 2015-11-21 ENCOUNTER — Encounter: Payer: Self-pay | Admitting: Cardiovascular Disease

## 2015-11-21 VITALS — BP 220/100 | HR 53 | Ht 63.0 in | Wt 164.5 lb

## 2015-11-21 DIAGNOSIS — I495 Sick sinus syndrome: Secondary | ICD-10-CM

## 2015-11-21 DIAGNOSIS — R0789 Other chest pain: Secondary | ICD-10-CM

## 2015-11-21 DIAGNOSIS — I4892 Unspecified atrial flutter: Secondary | ICD-10-CM | POA: Diagnosis not present

## 2015-11-21 DIAGNOSIS — I4891 Unspecified atrial fibrillation: Secondary | ICD-10-CM | POA: Diagnosis not present

## 2015-11-21 DIAGNOSIS — I10 Essential (primary) hypertension: Secondary | ICD-10-CM

## 2015-11-21 NOTE — Patient Instructions (Signed)
Medication Instructions:  Your physician recommends that you continue on your current medications as directed. Please refer to the Current Medication list given to you today.   Labwork: none  Testing/Procedures: none  Follow-Up: Your physician recommends that you schedule a follow-up appointment in: two months with Dr. Fletcher Anon.    Any Other Special Instructions Will Be Listed Below (If Applicable). DO NOT TAKE ELIQUIS December 17 OR December 18. You may resume after your pacemaker. Pacemaker appointment December 19. Arrival time: 7:30am [facility] [phone]  Use Entrance A Short Stay  Nothing to eat or drink after midnight the evening before your procedure.  You may take your morning medications with a sip of water EXCEPT FOR ELIQUIS.      If you need a refill on your cardiac medications before your next appointment, please call your pharmacy.

## 2015-11-21 NOTE — Progress Notes (Signed)
HPI  81 y.o. female who is here today for a follow-up regarding atrial flutter, chronic systolic heart failure and sick sinus syndrome.  She has known history of COPD, hypertension, type 2 diabetes,chronic kidney disease, dementia, chronic systolic heart failure and  atrial flutter. She underwent routine cardioversion on October 22 and converted to sinus rhythm. However, she had significant bradycardia with junctional rhythm in the 30s with hypotension. She was started on dopamine. Both metoprolol and amiodarone were discontinued. Echo showed EF 45%, severe biatrial enlargement with a left atrium of 54 mm, moderate MR and TR. It was felt her cardiomyopathy was 2/2 her arrhythmia. [facility] and was found to have 8 second postconversion pauses. A permanent pacemaker was planned but the patient wanted to wait. She refused the procedure. That was rescheduled twice to be done as an outpatient but she did not show up. We received a message from home health services that the patient lives by herself and has not been taking her medications as prescribed. Her blood pressure is elevated today but she reports that she did not take her medications this morning. She denies any chest pain or shortness of breath. She is scheduled to have pacemaker done on Monday.   No Known Allergies   Current Outpatient Prescriptions on File Prior to Visit  Medication Sig Dispense Refill  . amLODipine (NORVASC) 5 MG tablet Take 1 tablet (5 mg total) by mouth daily. 30 tablet 5  . apixaban (ELIQUIS) 2.5 MG TABS tablet Take 1 tablet (2.5 mg total) by mouth 2 (two) times daily. 28 tablet 0  . BAYER MICROLET LANCETS lancets Use to check fasting blood sugar daily.  Dx: E11.8 100 each 3  . donepezil (ARICEPT) 5 MG tablet Take 1 tablet (5 mg total) by mouth at bedtime. 30 tablet 5  . ferrous sulfate 325 (65 FE) MG tablet Take 1 tablet (325 mg total) by mouth daily with breakfast. 30 tablet  6  . furosemide (LASIX) 20 MG tablet Take 2 tablets by mouth daily as needed for fluid or edema (For weight Gain >3 pounds) (Patient taking differently: Take 20 mg by mouth daily. Take 1 tablet by mouth daily as needed for fluid or edema (For weight Gain >3 pounds)) 90 tablet 3  . glucose blood (BAYER CONTOUR NEXT TEST) test strip Use to check fasting blood sugar daily.  Dx: E11.8 100 each 3  . lisinopril (PRINIVIL,ZESTRIL) 40 MG tablet Take 1 tablet (40 mg total) by mouth daily. 40 tablet 5  . pantoprazole (PROTONIX) 40 MG tablet Take 1 tablet (40 mg total) by mouth daily at 12 noon. 30 tablet 5  . pravastatin (PRAVACHOL) 20 MG tablet Take 1 tablet (20 mg total) by mouth daily. 90 tablet 3   No current facility-administered medications on file prior to visit.     Past Medical History  Diagnosis Date  . Breast cancer ([place])     right-sided. s/p lumpectomy in 7/09. that was followed by mammoSite adjuvant radiation treatment.   . Mild memory disturbances not amounting to dementia   . HTN (hypertension)   . Diabetes type 2, controlled ([place])     if controlled not specified  . Obesity   . Hx of cholecystectomy   . Fibroid uterus     w/postmenopausal vaginal bleeding  . MENORRHAGIA, POSTMENOPAUSAL 02/06/2010    Qualifier: Diagnosis of  By: Diona Browner MD, Amy    . COPD (chronic obstructive pulmonary disease) ([place])   .  Atrial flutter ([place])     a. new onset 08/2014; b. s/p successful DCCV 09/26/14 NSR briefly -->junctional rhythm into the 30s reuiring neo, bagging, & dopamine, HR improved with amio washout   . Chronic systolic CHF (congestive heart failure) ([place]) 2/10    a. echo 01/2009: mod LVH, EF 0000000, mild diastolic dysfunction, normal RV size/fxn, ? RA mass; b. echo 09/01/2014: EF 40-45%, severely dilated LA/RA, trival  global pericardial effusion, mod-severe MR, mod-severe TR,   . Kidney failure      Past Surgical History  Procedure Laterality Date  . Abnormal myoview  12/09     lexiscan  myoview showed EF 70%. no wall motion abnormalities. there was an inferior defect that was likely artifact. possibly some anterior ischemia  . Breast lumpectomy  7/09  . Vaginal hysterectomy  2010    for post menopausal bleeding     Family History  Problem Relation Age of Onset  . Coronary artery disease Neg Hx     no premature CAD  . Hypertension Mother   . Hypertension Father   . Cancer Sister     breast     Social History   Social History  . Marital Status: Widowed    Spouse Name: N/A  . Number of Children: N/A  . Years of Education: N/A   Occupational History  . Not on file.   Social History Main Topics  . Smoking status: Never Smoker   . Smokeless tobacco: Never Used     Comment: does not smoke  . Alcohol Use: No  . Drug Use: No  . Sexual Activity: Not on file   Other Topics Concern  . Not on file   Social History Narrative   Pt lives in [place] with her sister. She has no children but does have nieces and nephews.       PHYSICAL EXAM   BP 220/100 mmHg  Pulse 53  Ht 5\' 3"  (1.6 m)  Wt 164 lb 8 oz (74.617 kg)  BMI 29.15 kg/m2 Constitutional: She is oriented to person, place, and time. She appears frail. No distress.  HENT: No nasal discharge.  Head: Normocephalic and atraumatic.  Eyes: Pupils are equal and round. No discharge.  Neck: Normal range of motion. Neck supple. No JVD present. No thyromegaly present.  Cardiovascular: Normal rate, regular rhythm, normal heart sounds. Exam reveals no gallop and no friction rub. No murmur heard.  Pulmonary/Chest: Effort normal and breath sounds normal. No stridor. No respiratory distress. She has no wheezes. She has no rales. She exhibits no tenderness.  Abdominal: Soft. Bowel sounds are normal. She exhibits no distension. There is no tenderness. There is no rebound and no guarding.  Musculoskeletal: Normal range of motion. She exhibits no edema and no tenderness.  Neurological: She is alert and oriented to  person, place, and time. Coordination normal.  Skin: Skin is warm and dry. No rash noted. She is not diaphoretic. No erythema. No pallor.  Psychiatric: She has a normal mood and affect. Her behavior is normal. Judgment and thought content normal.     EKG: Sinus bradycardia Right bundle branch block and left anterior fascicular block.  ABNORMAL     ASSESSMENT AND PLAN

## 2015-11-21 NOTE — Assessment & Plan Note (Signed)
Blood pressure is elevated today likely due to poor compliance. Recent blood pressure with her primary care physician was reasonably controlled. She did not take her morning medications today and thus I did not add any other medication. Continue treatment with lisinopril and amlodipine. Hydralazine intravenously can be used as needed for blood pressure control during her pacemaker placement.

## 2015-11-21 NOTE — Assessment & Plan Note (Signed)
The patient definitely seems to have dementia and difficulty remembering things. She did have syncopal episodes in the setting of documented 8 second pauses. She is accompanied today by her relative who mentioned that the patient is going to show up to her appointment for pacemaker on Monday. I asked him to hold Eliquis 2 days before the procedure.

## 2015-11-21 NOTE — Assessment & Plan Note (Signed)
She is in normal sinus rhythm today. Avoid beta blockers for now given bradycardia.

## 2015-11-24 ENCOUNTER — Encounter (HOSPITAL_COMMUNITY): Admission: RE | Payer: Self-pay | Source: Ambulatory Visit

## 2015-11-24 ENCOUNTER — Ambulatory Visit (HOSPITAL_COMMUNITY): Admission: RE | Admit: 2015-11-24 | Payer: Medicare Other | Source: Ambulatory Visit | Admitting: Internal Medicine

## 2015-11-24 ENCOUNTER — Other Ambulatory Visit: Payer: Self-pay | Admitting: *Deleted

## 2015-11-24 SURGERY — PACEMAKER IMPLANT
Anesthesia: LOCAL

## 2015-11-24 MED ORDER — LISINOPRIL 40 MG PO TABS: 40.0000 mg | ORAL_TABLET | Freq: Every day | ORAL | Status: DC

## 2015-11-26 ENCOUNTER — Telehealth: Payer: Self-pay

## 2015-11-26 NOTE — Telephone Encounter (Signed)
Monica Hurst with [facility] said she is at pts home; pt has missed her 3rd appt to have pacemaker placed; pt has barricaded herself in the home and closed the drapes; [facility] Education officer, museum has already previously done eval and Monica Hurst is sending FYI to [name] that she is contacting [facility].

## 2015-11-27 NOTE — Telephone Encounter (Signed)
I agree with this fully given report.

## 2015-11-27 NOTE — Telephone Encounter (Signed)
Millie with [employer] social worker saw pt on 11/26/15; pt was paranoid when talking with Millie; Millie did get meals on wheels restarted for pt. [facility] nurse will go out today to see pt.[facility] is going to contact adult protective services today. Wanted to keep [name] up to date on what is going on with pt.

## 2015-12-11 ENCOUNTER — Other Ambulatory Visit: Payer: Self-pay | Admitting: Family Medicine

## 2015-12-16 DIAGNOSIS — F0390 Unspecified dementia without behavioral disturbance: Secondary | ICD-10-CM | POA: Diagnosis not present

## 2015-12-16 DIAGNOSIS — I480 Paroxysmal atrial fibrillation: Secondary | ICD-10-CM | POA: Diagnosis not present

## 2015-12-16 DIAGNOSIS — I495 Sick sinus syndrome: Secondary | ICD-10-CM | POA: Diagnosis not present

## 2015-12-16 DIAGNOSIS — I4892 Unspecified atrial flutter: Secondary | ICD-10-CM | POA: Diagnosis not present

## 2015-12-18 ENCOUNTER — Ambulatory Visit: Payer: Self-pay | Admitting: Family Medicine

## 2015-12-19 ENCOUNTER — Telehealth: Payer: Self-pay | Admitting: Family Medicine

## 2015-12-19 NOTE — Telephone Encounter (Signed)
error 

## 2015-12-23 ENCOUNTER — Telehealth: Payer: Self-pay

## 2015-12-23 NOTE — Telephone Encounter (Signed)
Linda with [facility] left v/m with update; not able to meet goals with med administration and will be discharging pt soon; [facility] is working with pt. Linda request cb.

## 2016-01-06 ENCOUNTER — Ambulatory Visit: Payer: Medicare Other | Admitting: Cardiovascular Disease

## 2016-01-23 ENCOUNTER — Ambulatory Visit: Payer: Medicare Other | Admitting: Cardiovascular Disease

## 2016-01-26 ENCOUNTER — Encounter: Payer: Self-pay | Admitting: *Deleted

## 2016-05-13 ENCOUNTER — Other Ambulatory Visit: Payer: Self-pay

## 2016-05-13 MED ORDER — LISINOPRIL 40 MG PO TABS: 40.0000 mg | ORAL_TABLET | Freq: Every day | ORAL | Status: DC

## 2016-05-13 NOTE — Telephone Encounter (Signed)
Refill sent for Lisinopril 40 mg  

## 2016-08-08 ENCOUNTER — Other Ambulatory Visit: Payer: Self-pay | Admitting: Family Medicine

## 2016-08-08 ENCOUNTER — Other Ambulatory Visit: Payer: Self-pay | Admitting: Cardiology

## 2016-10-15 ENCOUNTER — Telehealth: Payer: Self-pay | Admitting: Cardiovascular Disease

## 2016-10-15 NOTE — Telephone Encounter (Signed)
-----   Message from Britt Bottom, [place] sent at 10/14/2016  4:50 PM EST ----- Hello, We received a refill request and pt is overdue for 2 month f/u please contact pt to schedule future appointment .  Thank you, Lenda Kelp

## 2016-10-15 NOTE — Telephone Encounter (Signed)
Tried to call patient 2 times, number was not correct, try other numbers could not lmov  Will try again.

## 2016-10-18 ENCOUNTER — Encounter: Payer: Self-pay | Admitting: Cardiovascular Disease

## 2016-10-18 NOTE — Telephone Encounter (Signed)
Sent letter to patient.

## 2016-11-09 ENCOUNTER — Other Ambulatory Visit: Payer: Self-pay | Admitting: Cardiology

## 2016-11-09 NOTE — Telephone Encounter (Signed)
Rx has been sent to the pharmacy electronically. ° °

## 2017-01-22 ENCOUNTER — Other Ambulatory Visit: Payer: Self-pay | Admitting: Cardiovascular Disease

## 2017-01-22 ENCOUNTER — Other Ambulatory Visit: Payer: Self-pay | Admitting: Family Medicine

## 2017-01-22 NOTE — Telephone Encounter (Signed)
Last office visit 11/13/2015.  Refill?

## 2017-01-30 ENCOUNTER — Other Ambulatory Visit: Payer: Self-pay | Admitting: Cardiovascular Disease

## 2017-01-30 ENCOUNTER — Other Ambulatory Visit: Payer: Self-pay | Admitting: Family Medicine

## 2017-02-08 ENCOUNTER — Encounter: Payer: Self-pay | Admitting: Family Medicine

## 2017-02-08 ENCOUNTER — Ambulatory Visit (INDEPENDENT_AMBULATORY_CARE_PROVIDER_SITE_OTHER): Payer: Medicare Other | Admitting: Family Medicine

## 2017-02-08 VITALS — BP 230/103 | HR 54 | Temp 97.5°F | Ht 60.75 in | Wt 174.8 lb

## 2017-02-08 DIAGNOSIS — E43 Unspecified severe protein-calorie malnutrition: Secondary | ICD-10-CM

## 2017-02-08 DIAGNOSIS — I5022 Chronic systolic (congestive) heart failure: Secondary | ICD-10-CM | POA: Diagnosis not present

## 2017-02-08 DIAGNOSIS — D508 Other iron deficiency anemias: Secondary | ICD-10-CM

## 2017-02-08 DIAGNOSIS — D509 Iron deficiency anemia, unspecified: Secondary | ICD-10-CM | POA: Diagnosis not present

## 2017-02-08 DIAGNOSIS — N183 Chronic kidney disease, stage 3 (moderate): Secondary | ICD-10-CM

## 2017-02-08 DIAGNOSIS — I480 Paroxysmal atrial fibrillation: Secondary | ICD-10-CM

## 2017-02-08 DIAGNOSIS — R7989 Other specified abnormal findings of blood chemistry: Secondary | ICD-10-CM

## 2017-02-08 DIAGNOSIS — E1122 Type 2 diabetes mellitus with diabetic chronic kidney disease: Secondary | ICD-10-CM

## 2017-02-08 DIAGNOSIS — I10 Essential (primary) hypertension: Secondary | ICD-10-CM | POA: Diagnosis not present

## 2017-02-08 DIAGNOSIS — I495 Sick sinus syndrome: Secondary | ICD-10-CM

## 2017-02-08 DIAGNOSIS — E1121 Type 2 diabetes mellitus with diabetic nephropathy: Secondary | ICD-10-CM | POA: Diagnosis not present

## 2017-02-08 DIAGNOSIS — I4892 Unspecified atrial flutter: Secondary | ICD-10-CM

## 2017-02-08 DIAGNOSIS — Z23 Encounter for immunization: Secondary | ICD-10-CM

## 2017-02-08 DIAGNOSIS — E785 Hyperlipidemia, unspecified: Secondary | ICD-10-CM

## 2017-02-08 DIAGNOSIS — F0390 Unspecified dementia without behavioral disturbance: Secondary | ICD-10-CM | POA: Diagnosis not present

## 2017-02-08 DIAGNOSIS — F03B0 Unspecified dementia, moderate, without behavioral disturbance, psychotic disturbance, mood disturbance, and anxiety: Secondary | ICD-10-CM

## 2017-02-08 DIAGNOSIS — J410 Simple chronic bronchitis: Secondary | ICD-10-CM

## 2017-02-08 LAB — HM DIABETES FOOT EXAM

## 2017-02-08 MED ORDER — AMLODIPINE BESYLATE 5 MG PO TABS: 5.0000 mg | ORAL_TABLET | Freq: Every day | ORAL | 1 refills | Status: AC

## 2017-02-08 MED ORDER — PANTOPRAZOLE SODIUM 40 MG PO TBEC: 40.0000 mg | DELAYED_RELEASE_TABLET | Freq: Every day | ORAL | 1 refills | Status: AC

## 2017-02-08 MED ORDER — FUROSEMIDE 20 MG PO TABS: 20.0000 mg | ORAL_TABLET | Freq: Every day | ORAL | 1 refills | Status: AC

## 2017-02-08 MED ORDER — LISINOPRIL 40 MG PO TABS: 40.0000 mg | ORAL_TABLET | Freq: Every day | ORAL | 1 refills | Status: AC

## 2017-02-08 MED ORDER — DONEPEZIL HCL 5 MG PO TABS: 5.0000 mg | ORAL_TABLET | Freq: Every day | ORAL | 3 refills | Status: AC

## 2017-02-08 MED ORDER — APIXABAN 2.5 MG PO TABS: 2.5000 mg | ORAL_TABLET | Freq: Two times a day (BID) | ORAL | 1 refills | Status: AC

## 2017-02-08 MED ORDER — APIXABAN 2.5 MG PO TABS: 2.5000 mg | ORAL_TABLET | Freq: Two times a day (BID) | ORAL | 1 refills | Status: DC

## 2017-02-08 MED ORDER — PRAVASTATIN SODIUM 20 MG PO TABS: 20.0000 mg | ORAL_TABLET | Freq: Every day | ORAL | 1 refills | Status: AC

## 2017-02-08 NOTE — Assessment & Plan Note (Signed)
Poor control off medications. Restart ASAP amlodipine and lisinopril.

## 2017-02-08 NOTE — Assessment & Plan Note (Addendum)
Due for re-eval and for eval of iron studies. Not clear if she was taking iron supplement.

## 2017-02-08 NOTE — Patient Instructions (Addendum)
Please stop at the front desk to set up  Cardiology follow up appt. Dr. Fletcher Anon.  Please stop at the lab to set up to have labs drawn.   Set up appt for annual eye exam.

## 2017-02-08 NOTE — Assessment & Plan Note (Signed)
Due for re-eval. 

## 2017-02-08 NOTE — Assessment & Plan Note (Signed)
Due for re-eval. Pt wishes to continue statin medication given preventative benefit.

## 2017-02-08 NOTE — Assessment & Plan Note (Signed)
Currently in NSR, nml rate. Pt encouraged to take eliquis.Marland Kitchen Refilled rx. Pt needs follow up with cardiology for yearly eval.

## 2017-02-08 NOTE — Progress Notes (Signed)
Subjective:    Patient ID: Monica Hurst, female    DOB: 06-10-1924, 83 y.o.   MRN: [id number]  HPI   84 year old female with dementia, systolic and diastolic heart failure, HTN,CKD stage 3,  DM, COPD,  parox afibaflutter and sinus node dysfunction ( tachy brady syndrome and trifasicular block) presents for follow up.   She has not been seen in  Over 1 year.  Last OPV with [facility] was 12/01/2015  Summary of past treatment is copied from that Cardiology note for continuity: She underwent routine cardioversion on September 27, 2015 and converted to sinus rhythm. However, she had significant bradycardia with junctional rhythm in the 30s with hypotension. She was started on dopamine. Both metoprolol and amiodarone were discontinued. Echo showed EF 45%, severe biatrial enlargement with a left atrium of 54 mm, moderate MR and TR. It was felt her cardiomyopathy was 2/2 her arrhythmia. [facility] for chest pain and was found to have 8 second postconversion pauses. A permanent pacemaker was planned but the patient wanted to wait. She refused the procedure. That was rescheduled THREE TIMES to be done as an outpatient but she did not show up.  Today 02/08/17  Pt blood pressure is elevated. She is noncompliant with her meds. She has been out of meds for last week.  No HA, no CP, no SOb, no dizziness.  Good appetite.  Good energy. Happy, no depression. She has  Swelling in legs bilaterally, worse after up on her feet.  Lasix helps with the swelling overall.  She is living on her own. She has niece  Monica Hurst) with her today (she checks on her daily), has a caregiver who comes in from church. She sets up the meds each morning.  Able to do bathing on her own.. But would benefit.   She reports stable memory. Takes Aricept. Stable no new changes.  She is not cooking at home, not driving.    BP Readings from Last 3 Encounters:  02/08/17 (!)  230/103  11/21/15 (!) 220/100  11/13/15 140/70   Blood pressure (!) 230/103, pulse (!) 54, temperature 97.5 F (36.4 C), temperature source Oral, height 5' 0.75" (1.543 m), weight 174 lb 12 oz (79.3 kg).  Wt Readings from Last 3 Encounters:  02/08/17 174 lb 12 oz (79.3 kg)  11/21/15 164 lb 8 oz (74.6 kg)  11/13/15 169 lb (76.7 kg)   Social History /Family History/Past Medical History reviewed and updated if needed.   Review of Systems  Constitutional: Negative for fatigue and fever.  HENT: Negative for ear pain.   Eyes: Negative for pain.  Respiratory: Negative for chest tightness and shortness of breath.   Cardiovascular: Positive for leg swelling. Negative for chest pain and palpitations.  Gastrointestinal: Negative for abdominal pain.  Genitourinary: Negative for dysuria.       Objective:   Physical Exam  Constitutional: Vital signs are normal. She appears well-developed and well-nourished. She is cooperative.  Non-toxic appearance. She does not appear ill. No distress.  Elderly female in NAD  HENT:  Head: Normocephalic.  Right Ear: Hearing, tympanic membrane, external ear and ear canal normal. Tympanic membrane is not erythematous, not retracted and not bulging.  Left Ear: Hearing, tympanic membrane, external ear and ear canal normal. Tympanic membrane is not erythematous, not retracted and not bulging.  Nose: No mucosal edema or rhinorrhea. Right sinus exhibits no maxillary sinus tenderness and no frontal sinus tenderness. Left sinus exhibits  no maxillary sinus tenderness and no frontal sinus tenderness.  Mouth/Throat: Uvula is midline, oropharynx is clear and moist and mucous membranes are normal.  Eyes: Conjunctivae, EOM and lids are normal. Pupils are equal, round, and reactive to light. Lids are everted and swept, no foreign bodies found.  Neck: Trachea normal and normal range of motion. Neck supple. Carotid bruit is not present. No thyroid mass and no thyromegaly  present.  Cardiovascular: Normal rate, regular rhythm, S1 normal, S2 normal, intact distal pulses and normal pulses.  Exam reveals no gallop and no friction rub.   Murmur heard.  Systolic murmur is present with a grade of 3/6  Pulmonary/Chest: Effort normal and breath sounds normal. No tachypnea. No respiratory distress. She has no decreased breath sounds. She has no wheezes. She has no rhonchi. She has no rales.  Abdominal: Soft. Normal appearance and bowel sounds are normal. There is no tenderness.  Neurological: She is alert. She has normal strength. She is disoriented. No cranial nerve deficit or sensory deficit. Gait abnormal.   Oriented to self and place not time  Skin: Skin is warm, dry and intact. No rash noted.  Psychiatric: Her speech is normal and behavior is normal. Judgment and thought content normal. Her mood appears not anxious. Cognition and memory are normal. She does not exhibit a depressed mood.   Diabetic foot exam: Abnormal inspection, bilateral hammer toes and corresponding calluses, bilateral bunions No skin breakdown Preulcerative calluses in multiple areas Normal DP pulses Normal sensation to light touch and monofilament Nails thckened        Assessment & Plan:

## 2017-02-08 NOTE — Assessment & Plan Note (Signed)
Per niece stable on aricept. At follow up in 6 month re-eval MMSE.

## 2017-02-08 NOTE — Assessment & Plan Note (Signed)
Stable on no inhalers.

## 2017-02-08 NOTE — Assessment & Plan Note (Signed)
Pt has improved po intake an weight has improved.

## 2017-02-08 NOTE — Assessment & Plan Note (Signed)
Stable in NSR.

## 2017-02-08 NOTE — Assessment & Plan Note (Addendum)
Fluid overload.Marland Kitchen Restart lasix 20 mg daily.  Sent in rx for compression hose.

## 2017-02-08 NOTE — Assessment & Plan Note (Signed)
Unclear control.  Rx for diabetic shoes given.

## 2017-02-08 NOTE — Assessment & Plan Note (Signed)
Due for follow up with DR. Arida. She rescheduled and missed 3 pacemaker placements.. Pt does not remember discussing this at all. Discussed with niece in detail.

## 2017-02-09 LAB — CBC WITH DIFFERENTIAL/PLATELET
BASOS ABS: 0 10*3/uL (ref 0.0–0.1)
Basophils Relative: 0.9 % (ref 0.0–3.0)
Eosinophils Absolute: 0.3 10*3/uL (ref 0.0–0.7)
Eosinophils Relative: 5 % (ref 0.0–5.0)
HEMATOCRIT: 37 % (ref 36.0–46.0)
HEMOGLOBIN: 11.6 g/dL — AB (ref 12.0–15.0)
LYMPHS PCT: 24.9 % (ref 12.0–46.0)
Lymphs Abs: 1.3 10*3/uL (ref 0.7–4.0)
MCHC: 31.3 g/dL (ref 30.0–36.0)
MCV: 90.7 fl (ref 78.0–100.0)
MONOS PCT: 8.1 % (ref 3.0–12.0)
Monocytes Absolute: 0.4 10*3/uL (ref 0.1–1.0)
NEUTROS ABS: 3.1 10*3/uL (ref 1.4–7.7)
Neutrophils Relative %: 61.1 % (ref 43.0–77.0)
Platelets: 84 10*3/uL — ABNORMAL LOW (ref 150.0–400.0)
RBC: 4.07 Mil/uL (ref 3.87–5.11)
RDW: 15.7 % — ABNORMAL HIGH (ref 11.5–15.5)
WBC: 5.1 10*3/uL (ref 4.0–10.5)

## 2017-02-09 LAB — COMPREHENSIVE METABOLIC PANEL
ALBUMIN: 3.6 g/dL (ref 3.5–5.2)
ALK PHOS: 50 U/L (ref 39–117)
ALT: 12 U/L (ref 0–35)
AST: 16 U/L (ref 0–37)
BILIRUBIN TOTAL: 0.4 mg/dL (ref 0.2–1.2)
BUN: 25 mg/dL — ABNORMAL HIGH (ref 6–23)
CALCIUM: 9.3 mg/dL (ref 8.4–10.5)
CO2: 32 meq/L (ref 19–32)
Chloride: 108 mEq/L (ref 96–112)
Creatinine, Ser: 1.41 mg/dL — ABNORMAL HIGH (ref 0.40–1.20)
GFR: 45.02 mL/min — AB (ref >60.00)
Glucose, Bld: 88 mg/dL (ref 70–99)
Potassium: 4.3 mEq/L (ref 3.5–5.1)
Sodium: 144 mEq/L (ref 135–145)
Total Protein: 6.4 g/dL (ref 6.0–8.3)

## 2017-02-09 LAB — LIPID PANEL
CHOL/HDL RATIO: 3
CHOLESTEROL: 181 mg/dL (ref 0–200)
HDL: 65.6 mg/dL (ref >39.00)
LDL CALC: 98 mg/dL (ref 0–99)
NonHDL: 115.16
TRIGLYCERIDES: 85 mg/dL (ref 0.0–149.0)
VLDL: 17 mg/dL (ref 0.0–40.0)

## 2017-02-09 LAB — IBC PANEL
Iron: 64 ug/dL (ref 42–145)
SATURATION RATIOS: 20.1 % (ref 20.0–50.0)
TRANSFERRIN: 228 mg/dL (ref 212.0–360.0)

## 2017-02-09 LAB — PATHOLOGIST SMEAR REVIEW

## 2017-02-09 LAB — FERRITIN: FERRITIN: 26.7 ng/mL (ref 10.0–291.0)

## 2017-02-09 LAB — HEMOGLOBIN A1C: Hgb A1c MFr Bld: 6.1 % (ref 4.6–6.5)

## 2017-02-10 ENCOUNTER — Encounter: Payer: Self-pay | Admitting: *Deleted

## 2017-03-01 ENCOUNTER — Ambulatory Visit (INDEPENDENT_AMBULATORY_CARE_PROVIDER_SITE_OTHER): Payer: Medicare Other | Admitting: Cardiovascular Disease

## 2017-03-01 ENCOUNTER — Encounter: Payer: Self-pay | Admitting: Cardiovascular Disease

## 2017-03-01 VITALS — BP 150/82 | HR 62 | Ht 64.0 in | Wt 164.8 lb

## 2017-03-01 DIAGNOSIS — I4892 Unspecified atrial flutter: Secondary | ICD-10-CM | POA: Diagnosis not present

## 2017-03-01 DIAGNOSIS — I5022 Chronic systolic (congestive) heart failure: Secondary | ICD-10-CM | POA: Diagnosis not present

## 2017-03-01 DIAGNOSIS — I10 Essential (primary) hypertension: Secondary | ICD-10-CM | POA: Diagnosis not present

## 2017-03-01 NOTE — Progress Notes (Signed)
Cardiology Office Note   Date:  03/01/2017   ID:  Monica Hurst, DOB Aug 07, 1924, MRN [id number]  PCP:  Eliezer Lofts, MD  Cardiologist:   Kathlyn Sacramento, MD   Chief Complaint  Patient presents with  . other    Last seen in 2016. Meds reviewed by the pt. verbally. "doing well."       History of Present Illness: Monica Hurst is a 83 y.o. female who presents for A follow-up visit regarding paroxysmal atrial flutter, chronic systolic heart failure and sick sinus syndrome. She has other chronic medical conditions that include COPD, hypertension, type 2 diabetes,chronic kidney disease and advanced dementia Echo in November 2016 showed EF 45%, severe biatrial enlargement with a left atrium of 54 mm, moderate MR and TR. It was felt her cardiomyopathy was 2/2 her arrhythmia. She had previous 8 second pause and permanent pacemaker was recommended for tachycardia-bradycardia syndrome. The patient declined. She has not been seen in over a year and has been doing reasonably well. History is limited by her dementia although she reports no chest pain, shortness of breath, palpitations, dizziness or syncope. She is accompanied by her niece. The patient lives by herself.   Past Medical History:  Diagnosis Date  . Atrial flutter ([place])    a. new onset 08/2014; b. s/p successful DCCV 09/26/14 NSR briefly -->junctional rhythm into the 30s reuiring neo, bagging, & dopamine, HR improved with amio washout   . Breast cancer ([place])    right-sided. s/p lumpectomy in 7/09. that was followed by mammoSite adjuvant radiation treatment.   . Chronic systolic CHF (congestive heart failure) ([place]) 2/10   a. echo 01/2009: mod LVH, EF 83-41%, mild diastolic dysfunction, normal RV size/fxn, ? RA mass; b. echo 09/01/2014: EF 40-45%, severely dilated LA/RA, trival  global pericardial effusion, mod-severe MR, mod-severe TR,   . COPD (chronic obstructive pulmonary disease) ([place])   . Diabetes type 2,  controlled ([place])    if controlled not specified  . Fibroid uterus    w/postmenopausal vaginal bleeding  . HTN (hypertension)   . Hx of cholecystectomy   . Kidney failure   . MENORRHAGIA, POSTMENOPAUSAL 02/06/2010   Qualifier: Diagnosis of  By: Diona Browner MD, Amy    . Mild memory disturbances not amounting to dementia   . Obesity     Past Surgical History:  Procedure Laterality Date  . abnormal myoview  12/09    lexiscan myoview showed EF 70%. no wall motion abnormalities. there was an inferior defect that was likely artifact. possibly some anterior ischemia  . BREAST LUMPECTOMY  7/09  . VAGINAL HYSTERECTOMY  2010   for post menopausal bleeding     Current Outpatient Prescriptions  Medication Sig Dispense Refill  . amLODipine (NORVASC) 5 MG tablet Take 1 tablet (5 mg total) by mouth daily. 90 tablet 1  . apixaban (ELIQUIS) 2.5 MG TABS tablet Take 1 tablet (2.5 mg total) by mouth 2 (two) times daily. 60 tablet 1  . BAYER MICROLET LANCETS lancets Use to check fasting blood sugar daily.  Dx: E11.8 100 each 3  . donepezil (ARICEPT) 5 MG tablet Take 1 tablet (5 mg total) by mouth at bedtime. 90 tablet 3  . ferrous sulfate 325 (65 FE) MG tablet Take 1 tablet (325 mg total) by mouth daily with breakfast. 30 tablet 6  . furosemide (LASIX) 20 MG tablet Take 1 tablet (20 mg total) by mouth daily. Take 1 tablet by mouth daily as needed for fluid or  edema (For weight Gain >3 pounds) 90 tablet 1  . glucose blood (BAYER CONTOUR NEXT TEST) test strip Use to check fasting blood sugar daily.  Dx: E11.8 100 each 3  . lisinopril (PRINIVIL,ZESTRIL) 40 MG tablet Take 1 tablet (40 mg total) by mouth daily. 90 tablet 1  . pantoprazole (PROTONIX) 40 MG tablet Take 1 tablet (40 mg total) by mouth daily at 12 noon. 90 tablet 1  . pravastatin (PRAVACHOL) 20 MG tablet Take 1 tablet (20 mg total) by mouth daily. 90 tablet 1   No current facility-administered medications for this visit.     Allergies:   Patient  has no known allergies.    Social History:  The patient  reports that she has never smoked. She has never used smokeless tobacco. She reports that she does not drink alcohol or use drugs.   Family History:  The patient's family history includes Cancer in her sister; Hypertension in her father and mother.    ROS:  Please see the history of present illness.   Otherwise, review of systems are positive for none.   All other systems are reviewed and negative.    PHYSICAL EXAM: VS:  BP (!) 150/82 (BP Location: Left Arm, Patient Position: Sitting, Cuff Size: Normal)   Pulse 62   Ht 5\' 4"  (1.626 m)   Wt 164 lb 12 oz (74.7 kg)   BMI 28.28 kg/m  , BMI Body mass index is 28.28 kg/m. GEN: Well nourished, well developed, in no acute distress  HEENT: normal  Neck: no JVD, carotid bruits, or masses Cardiac: RRR; no murmurs, rubs, or gallops,no edema  Respiratory:  clear to auscultation bilaterally, normal work of breathing GI: soft, nontender, nondistended, + BS MS: no deformity or atrophy  Skin: warm and dry, no rash Neuro:  Strength and sensation are intact Psych: euthymic mood, full affect   EKG:  EKG is ordered today. The ekg ordered today demonstrates normal sinus rhythm with right bundle branch block.   Recent Labs: 02/08/2017: ALT 12; BUN 25; Creatinine, Ser 1.41; Hemoglobin 11.6; Platelets 84.0 Repeated and verified X2.; Potassium 4.3; Sodium 144    Lipid Panel    Component Value Date/Time   CHOL 181 02/08/2017 1552   CHOL 102 09/01/2014 0314   TRIG 85.0 02/08/2017 1552   TRIG 67 09/01/2014 0314   HDL 65.60 02/08/2017 1552   HDL 31 (L) 09/01/2014 0314   CHOLHDL 3 02/08/2017 1552   VLDL 17.0 02/08/2017 1552   VLDL 13 09/01/2014 0314   LDLCALC 98 02/08/2017 1552   LDLCALC 58 09/01/2014 0314      Wt Readings from Last 3 Encounters:  03/01/17 164 lb 12 oz (74.7 kg)  02/08/17 174 lb 12 oz (79.3 kg)  11/21/15 164 lb 8 oz (74.6 kg)       No flowsheet data  found.    ASSESSMENT AND PLAN:  1.  Paroxysmal atrial flutter: Currently maintaining in sinus rhythm with no evidence of recurrent arrhythmia. She is on long-term anticoagulation with small dose Eliquis given her age and chronic kidney disease. I reviewed most recent labs from this month which showed improvement in creatinine to 1.4. Creatinine the past was 1.64. In spite of this, I elected to keep her on the 2.5 mg dose given that the platelet count was 84,000.  2. Essential hypertension: Blood pressure was severely elevated recently when she saw Dr. Diona Browner. Currently blood pressure is reasonable. The patient has known history of poor adherence to medications. Avoid  any medication that can cause bradycardia.  3. Sinus node dysfunction: Permanent pacemaker was recommended before but she did not show up. She has been stable for more than a year with no episodes of syncope. Given her age, I recommend conservative approach at this time and reserving pacemaker for documented symptomatic bradycardic events.  4. Chronic systolic heart failure: She appears to be euvolemic. Avoid beta blockers due to sinus node dysfunction. Continue treatment with lisinopril.  Disposition:   FU with me in 6 months  Signed,  Kathlyn Sacramento, MD  03/01/2017 2:47 PM    [place]

## 2017-03-01 NOTE — Patient Instructions (Signed)

## 2017-07-01 ENCOUNTER — Telehealth: Payer: Self-pay | Admitting: Family Medicine

## 2017-07-01 NOTE — Telephone Encounter (Signed)
Proche Grave @ [facility] called to let you know pt passed 19-Jul-2023 and they will be bring death certificate for you to sign

## 2017-07-06 DEATH — deceased

## 2017-08-15 ENCOUNTER — Ambulatory Visit: Payer: Medicare Other

## 2017-08-23 ENCOUNTER — Encounter: Payer: Medicare Other | Admitting: Family Medicine
